# Patient Record
Sex: Female | Born: 1985
Health system: Southern US, Community
[De-identification: ages and names within clinical notes are randomized; demographics above are authoritative.]

## PROBLEM LIST (undated history)

## (undated) DIAGNOSIS — Z789 Other specified health status: Secondary | ICD-10-CM

## (undated) DIAGNOSIS — N390 Urinary tract infection, site not specified: Secondary | ICD-10-CM

## (undated) DIAGNOSIS — F419 Anxiety disorder, unspecified: Secondary | ICD-10-CM

## (undated) NOTE — H&P (Signed)
 Formatting of this note is different from the original. [facility]    [facility] [phone] [address] [place] [address]  [facility] MODERATE SEDATION H&P  NAME: Jenna Gonzalez MRN: [id number] AGE: 55 year old, DOB: February 14, 1985 Primary care Provider: Mardy Gonzalez Shivers, Gonzalez  Attending Provider:  [place] Jenna DELENA, Gonzalez  Procedure(s): COLONOSCOPY  Diagnosis/ Indications/ Symptoms for procedure:   rectal bleeding  PAST MEDICAL HISTORY: Past Medical History:  Diagnosis Date  ? NO PREVIOUS SIGNIFICANT MEDICAL ILLNESSES.    Past Surgical History:  Procedure Laterality Date  ? KNEE SURGERY Right    CURRENT MEDICATIONS: Current OP Meds    Start     Stop Sig Ordered   08/01/16 0000  adapalene (DIFFERIN) 0.1 % external gel     -- Apply to affected areas once daily. 08/01/16 1332   12/01/15 0000  norgestimate -ethinyl estradiol (TRI-SPRINTEC) 0.18/0.215/0.25 mg-35 mcg oral TABS tablet  daily    Comments:  Hold until patient requests   -- Take 1 tablet by mouth every day. 12/01/15 1630   12/01/15 0000  spironolactone (ALDACTONE) 50 mg oral tablet  daily     11/24 2359 Take 1 tablet (50 mg total) by mouth every day. 12/01/15 1640   12/01/15 0000  venlafaxine , 24-hour, (EFFEXOR  XR) 75 mg oral capsule  daily    Comments:  Hold until patient requests   -- Take 1 capsule (75 mg total) by mouth every day. 12/01/15 1630    ALLERGIES: Amoxicillin  Is this patient taking an anticoagulant or antithrombotic medication?  No.  No results for input(s): PTI, INR in the last 72 hours.  Exam:    Normal    Abnormal (please describe below) Mental Status: [x]                           []   Heart:   [x]     []  Lung:   [x]     []  Abdomen:  [x]     []  HEENT:  []     []  Neck:   []     []  Spine:   []     []  Ext:   []     []   Neuro:   []     []   Abnormal Findings:  none  The ASA Physical Status Classification System:  P1 A normal healthy patient  The benefits of the colonoscopy  reviewed with Jenna Gonzalez are the ability to directly examine, perform tissue sampling and administer treatment.   I have reviewed with her the alternatives for a colonoscopy would be a barium enema, sigmoidoscopy, fecal occult blood testing, or CT colonography.  Risks of colonoscopy were reviewed and the discussion included; minimal risk of bleeding, inflammation, perforation, medication reactions, infection or the need for surgery.  The sedation alternatives, including no sedation have been discussed. The benefits of receiving sedation include temporary decreased level of consciousness; memory loss and increased comfort during the procedure have been reviewed.  I have discussed that there are minimal risks with sedation; they include allergic reactions, respiratory depression, cardiac rhythm changes, and minor drops in oxygen levels and/or blood pressure changes.  Reactions to the medications can be treated to reverse the effects.  All of the above has been discussed with Jenna Gonzalez and her questions have been answered. She understands the risks, benefits and alternatives. She understands that it is possible to have an incomplete examination due to anatomy or effectiveness of the prep.  Jenna Gonzalez wishes to proceed.  Informed consent has been obtained.   Electronically signed by Jenna Gonzalez 11/09/2016 6:15 AM  Electronically signed by Gonzalez Jenna LABOR, Gonzalez at 11/09/2016  6:16 AM CST

---

## 2012-02-16 ENCOUNTER — Ambulatory Visit: Payer: Self-pay

## 2013-06-17 IMAGING — CR DG KNEE COMPLETE 4+V*R*
1 series · 4 of 4 positions shown · non-contrast
Comparison: none

REASON FOR EXAM: pain
COMMENTS:

PROCEDURE:     MDR - MDR KNEE RT COMPLETE W/OBLIQUES  - February 16, 2012  [DATE]
RESULT:     Comparison:  None

[Series 1: ap · 0.17mm/px · 4 of 4 slices shown]
[im 1/4]
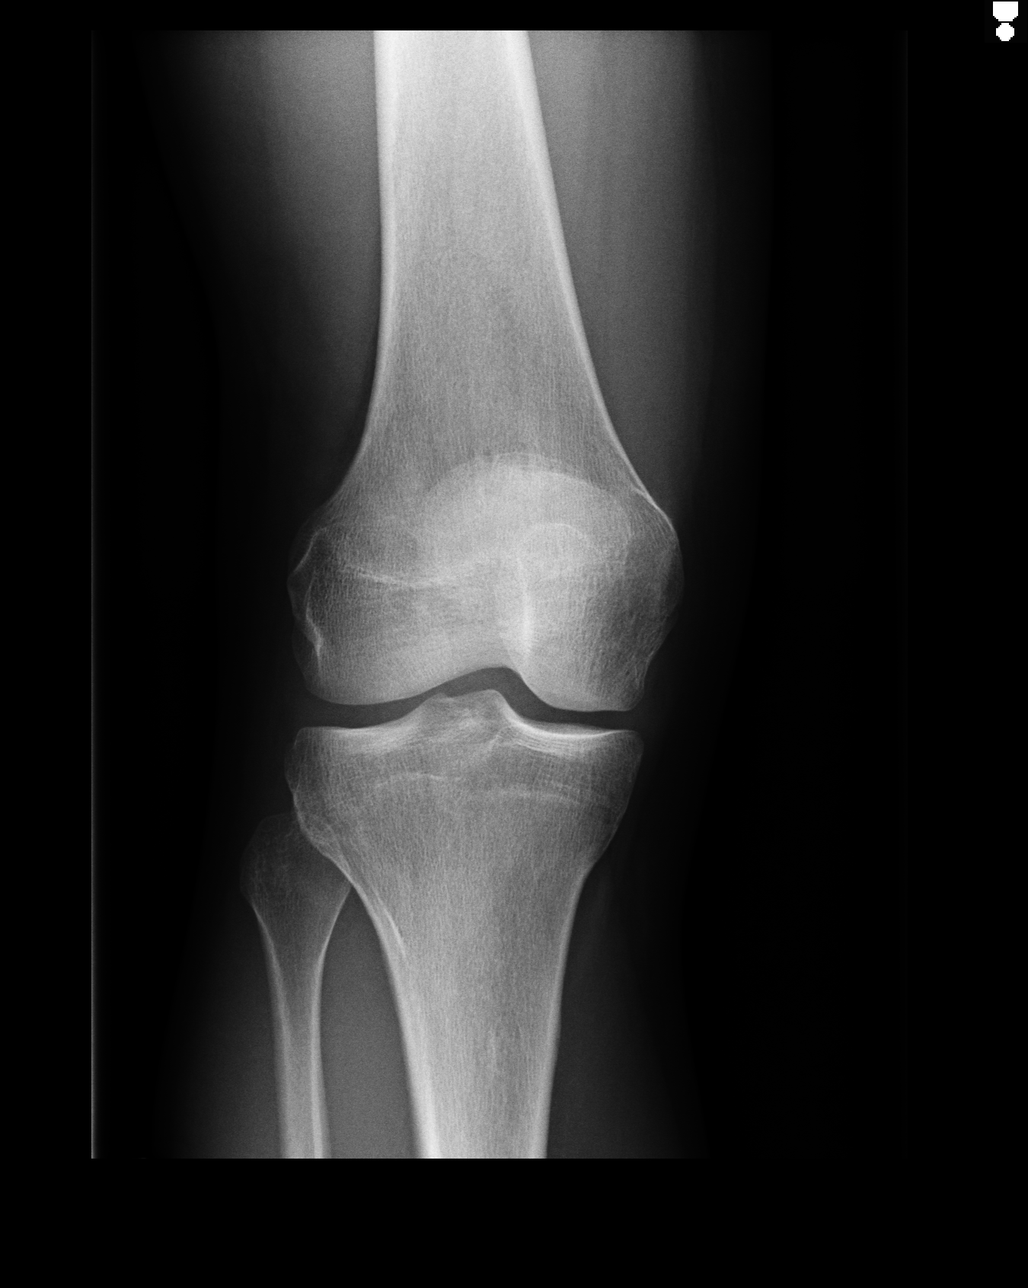
[im 2/4]
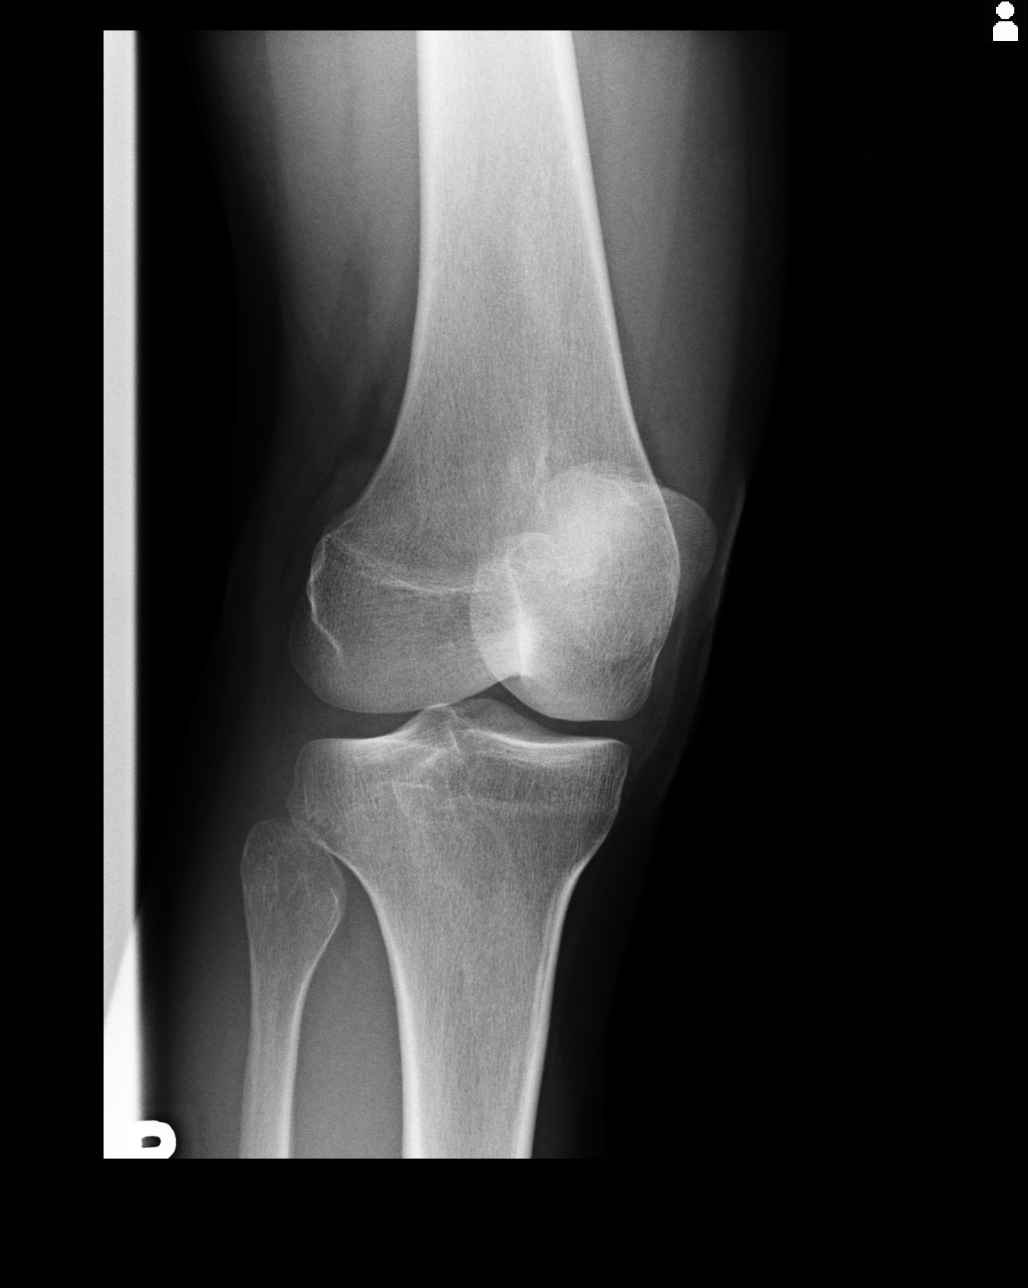
[im 3/4]
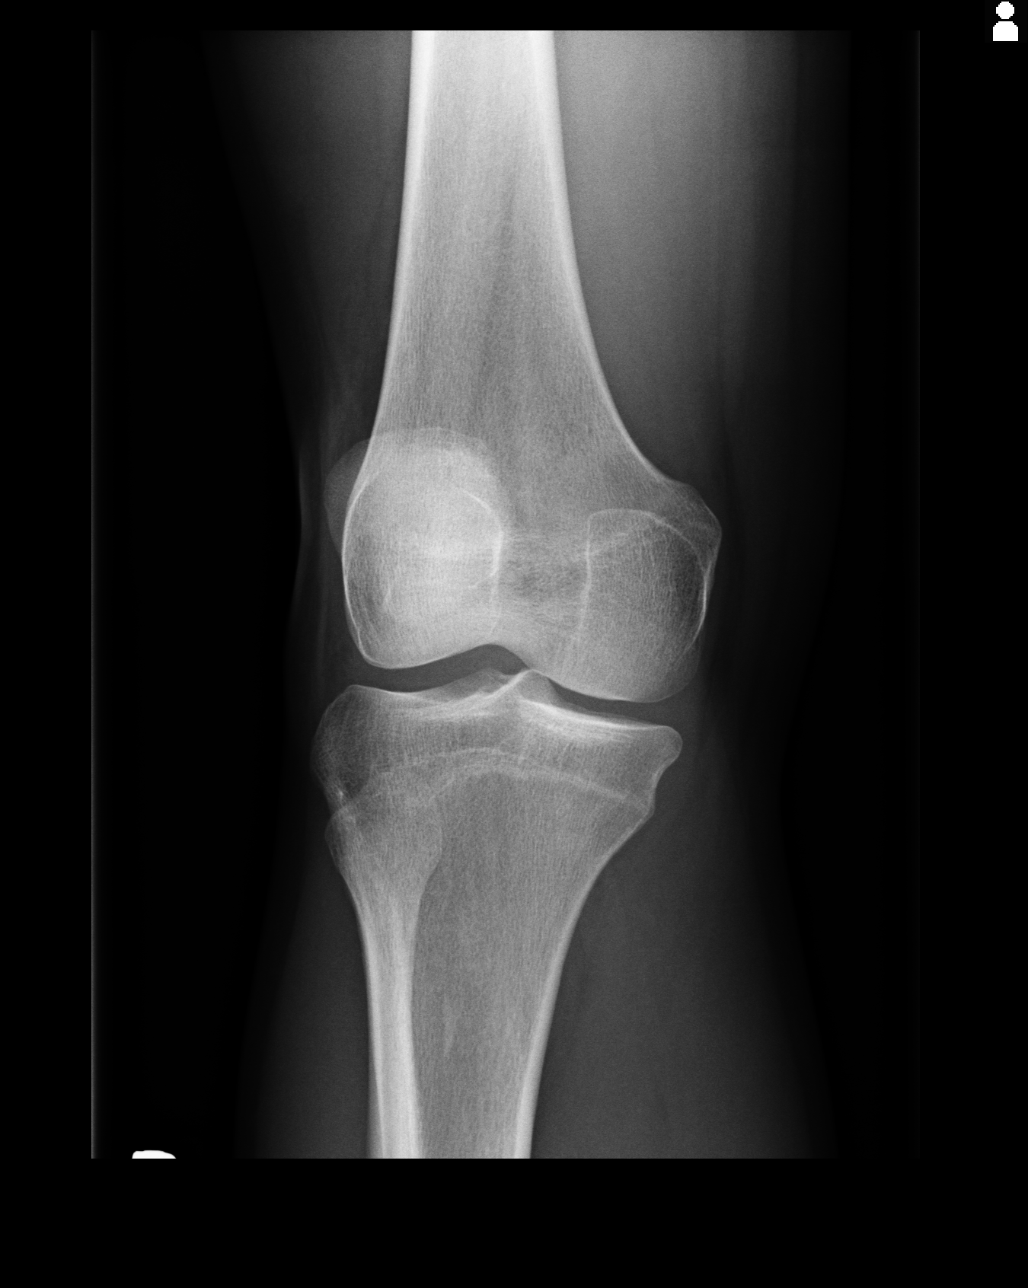
[im 4/4]
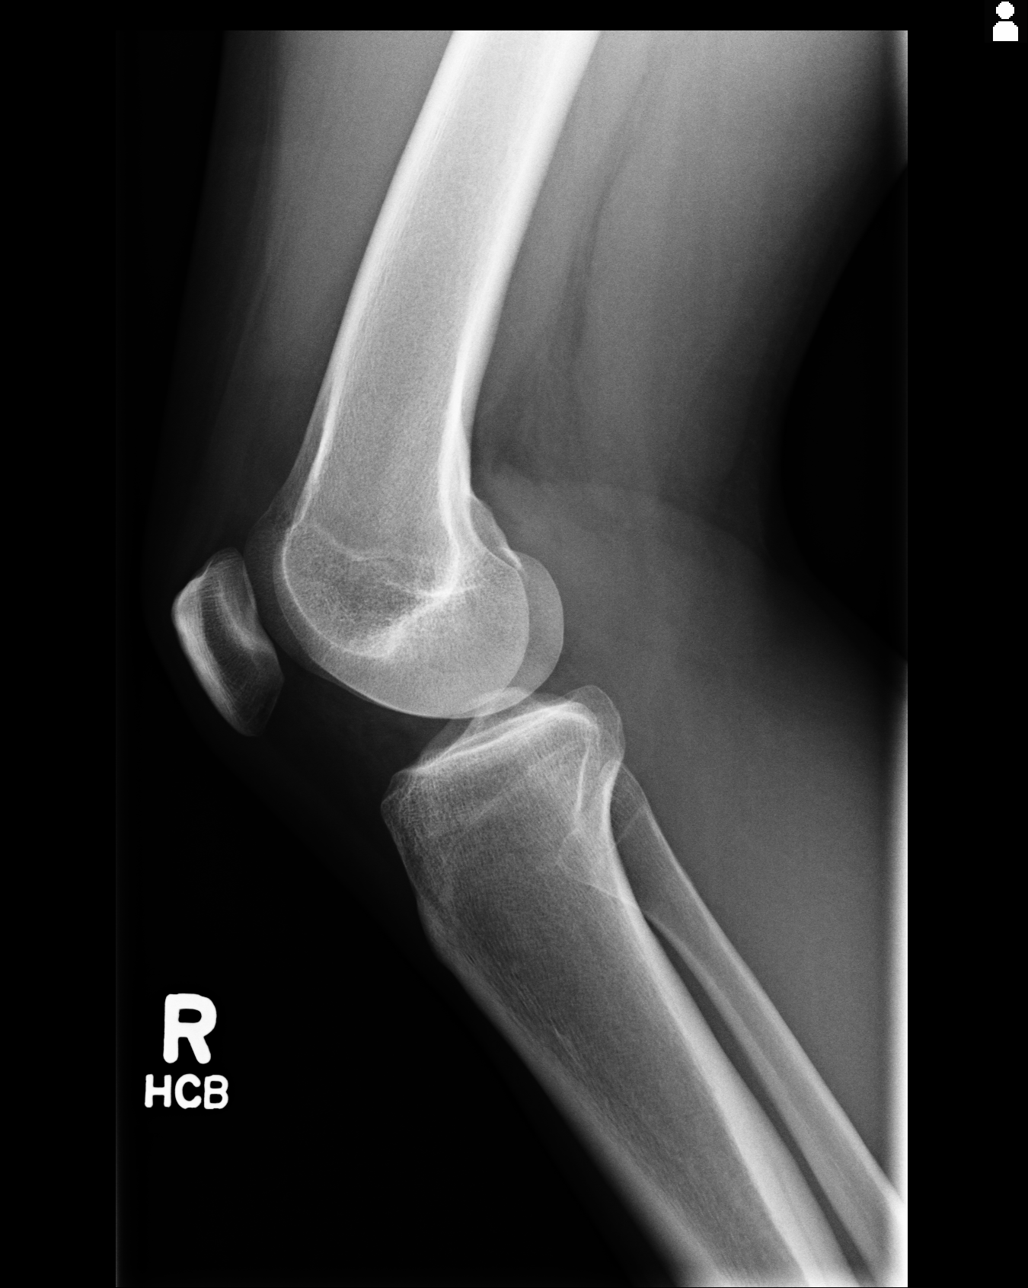

[4 of 4 positions shown; findings below may reference images not displayed]

FINDINGS: 4 views of the right knee demonstrates no acute fracture or dislocation.
There is no significant joint effusion.
IMPRESSION: No acute osseous injury of the right knee.

[REDACTED]

## 2017-01-01 HISTORY — PX: APPENDECTOMY: SHX54

## 2019-06-23 LAB — HM PAP SMEAR: HM Pap smear: NORMAL

## 2020-10-01 ENCOUNTER — Ambulatory Visit: Payer: No Typology Code available for payment source | Admitting: Family

## 2020-10-05 ENCOUNTER — Ambulatory Visit (INDEPENDENT_AMBULATORY_CARE_PROVIDER_SITE_OTHER): Payer: No Typology Code available for payment source | Admitting: Family

## 2020-10-05 ENCOUNTER — Other Ambulatory Visit: Payer: Self-pay | Admitting: Family

## 2020-10-05 ENCOUNTER — Other Ambulatory Visit (HOSPITAL_BASED_OUTPATIENT_CLINIC_OR_DEPARTMENT_OTHER): Payer: Self-pay

## 2020-10-05 ENCOUNTER — Other Ambulatory Visit: Payer: Self-pay

## 2020-10-05 ENCOUNTER — Encounter: Payer: Self-pay | Admitting: Family

## 2020-10-05 VITALS — BP 118/76 | HR 80 | Temp 98.0°F | Ht 65.0 in | Wt 150.4 lb

## 2020-10-05 DIAGNOSIS — Z23 Encounter for immunization: Secondary | ICD-10-CM | POA: Diagnosis not present

## 2020-10-05 DIAGNOSIS — F419 Anxiety disorder, unspecified: Secondary | ICD-10-CM | POA: Diagnosis not present

## 2020-10-05 DIAGNOSIS — R5383 Other fatigue: Secondary | ICD-10-CM | POA: Diagnosis not present

## 2020-10-05 DIAGNOSIS — A630 Anogenital (venereal) warts: Secondary | ICD-10-CM

## 2020-10-05 MED ORDER — VENLAFAXINE HCL ER 37.5 MG PO CP24
37.5000 mg | ORAL_CAPSULE | Freq: Every day | ORAL | 0 refills | Status: DC
  Filled 2020-10-05: qty 90, 90d supply, fill #0

## 2020-10-05 NOTE — Progress Notes (Signed)
  Jenna Gonzalez is a 35 y.o. female with the following history as recorded in EpicCare:  There are no problems to display for this patient.   Current Outpatient Medications  Medication Sig Dispense Refill   venlafaxine XR (EFFEXOR XR) 37.5 MG 24 hr capsule Take 1 capsule (37.5 mg total) by mouth daily with breakfast. 90 capsule 0   No current facility-administered medications for this visit.    Allergies: Amoxicillin  No past medical history on file.  The histories are not reviewed yet. Please review them in the "History" navigator section and refresh this [place].  No family history on file.  Social History   Tobacco Use   Smoking status: Never   Smokeless tobacco: Never  Substance Use Topics   Alcohol use: Not Currently    Subjective:   Presents as a new patient; would like to discuss options for anxiety that are safe for pregnancy and breast-feeding; would like to try to get pregnant mid 2023;  Is currently taking Effexor and has been on for 8- 9 years and done well;    LMP- 2 weeks ago  Objective:  Vitals:   10/05/20 1455  BP: 118/76  Pulse: 80  Temp: 98 F (36.7 C)  TempSrc: Oral  SpO2: 99%  Weight: 150 lb 6.4 oz (68.2 kg)  Height: $Remove'5\' 5"'WlWBVqa$  (1.651 m)    General: Well developed, well nourished, in no acute distress  Skin : Warm and dry.  Head: Normocephalic and atraumatic  Lungs: Respirations unlabored; clear to auscultation bilaterally without wheeze, rales, rhonchi  CVS exam: normal rate and regular rhythm.  Neurologic: Alert and oriented; speech intact; face symmetrical; moves all extremities well; CNII-XII intact without focal deficit   Assessment:  1. Anxiety   2. Other fatigue   3. Need for influenza vaccination     Plan:  Will go ahead and start taper for Effexor due to pre-pregnancy planning; will plan to start Sertraline as alternative;  Check CBC, CMP, TSH today; Flu shot given; Patient also referred to GYN due to history of vaginal wart  requiring treatment;   This visit occurred during the SARS-CoV-2 public health emergency.  Safety protocols were in place, including screening questions prior to the visit, additional usage of staff PPE, and extensive cleaning of exam room while observing appropriate contact time as indicated for disinfecting solutions.    No follow-ups on file.  Orders Placed This Encounter  Procedures   Flu Vaccine QUAD 6+ mos PF IM (Fluarix Quad PF)   CBC with Differential/Platelet   Comp Met (CMET)   TSH   HM PAP SMEAR    This external order was created through the Results Console.     Requested Prescriptions   Signed Prescriptions Disp Refills   venlafaxine XR (EFFEXOR XR) 37.5 MG 24 hr capsule 90 capsule 0    Sig: Take 1 capsule (37.5 mg total) by mouth daily with breakfast.

## 2020-10-05 NOTE — Patient Instructions (Signed)
Taper Effexor XR;  Take 1 tablet daily x 2 weeks of the 37.5 mg dosage; then cut back to every other day x 2 weeks; then cut back to every 3rd day x 1 week; see how you are doing at that point- if feeling okay, go ahead and stop; let us know when how the taper is going and when you are ready to start the Sertraline.

## 2020-10-06 LAB — CBC WITH DIFFERENTIAL/PLATELET
Basophils Absolute: 0.1 10*3/uL (ref 0.0–0.1)
Basophils Relative: 0.9 % (ref 0.0–3.0)
Eosinophils Absolute: 0.1 10*3/uL (ref 0.0–0.7)
Eosinophils Relative: 1.6 % (ref 0.0–5.0)
HCT: 42.2 % (ref 36.0–46.0)
Hemoglobin: 14.2 g/dL (ref 12.0–15.0)
Lymphocytes Relative: 31.7 % (ref 12.0–46.0)
Lymphs Abs: 2.4 10*3/uL (ref 0.7–4.0)
MCHC: 33.6 g/dL (ref 30.0–36.0)
MCV: 89.3 fl (ref 78.0–100.0)
Monocytes Absolute: 0.4 10*3/uL (ref 0.1–1.0)
Monocytes Relative: 4.9 % (ref 3.0–12.0)
Neutro Abs: 4.6 10*3/uL (ref 1.4–7.7)
Neutrophils Relative %: 60.9 % (ref 43.0–77.0)
Platelets: 226 10*3/uL (ref 150.0–400.0)
RBC: 4.72 Mil/uL (ref 3.87–5.11)
RDW: 13.1 % (ref 11.5–15.5)
WBC: 7.6 10*3/uL (ref 4.0–10.5)

## 2020-10-06 LAB — COMPREHENSIVE METABOLIC PANEL
ALT: 12 U/L (ref 0–35)
AST: 18 U/L (ref 0–37)
Albumin: 4.4 g/dL (ref 3.5–5.2)
Alkaline Phosphatase: 64 U/L (ref 39–117)
BUN: 16 mg/dL (ref 6–23)
CO2: 27 mEq/L (ref 19–32)
Calcium: 9.6 mg/dL (ref 8.4–10.5)
Chloride: 101 mEq/L (ref 96–112)
Creatinine, Ser: 0.84 mg/dL (ref 0.40–1.20)
GFR: 91.48 mL/min (ref >60.00)
Glucose, Bld: 87 mg/dL (ref 70–99)
Potassium: 4.1 mEq/L (ref 3.5–5.1)
Sodium: 137 mEq/L (ref 135–145)
Total Bilirubin: 0.3 mg/dL (ref 0.2–1.2)
Total Protein: 6.6 g/dL (ref 6.0–8.3)

## 2020-10-06 LAB — TSH: TSH: 2.36 u[IU]/mL (ref 0.35–5.50)

## 2020-10-13 ENCOUNTER — Telehealth: Payer: Self-pay | Admitting: *Deleted

## 2020-10-13 NOTE — Telephone Encounter (Signed)
Referral was placed for patient to gyn and she has not heard from them.  Referral was placed on 10/05/20.    Do you know if they are behind in referrals at gyn?

## 2020-10-14 NOTE — Telephone Encounter (Signed)
Left message on machine advising she can call.  [facility] given for [facility].

## 2020-12-23 ENCOUNTER — Ambulatory Visit (INDEPENDENT_AMBULATORY_CARE_PROVIDER_SITE_OTHER): Payer: No Typology Code available for payment source | Admitting: Family Medicine

## 2020-12-23 ENCOUNTER — Encounter: Payer: Self-pay | Admitting: Family Medicine

## 2020-12-23 ENCOUNTER — Other Ambulatory Visit (HOSPITAL_COMMUNITY)
Admission: RE | Admit: 2020-12-23 | Discharge: 2020-12-23 | Disposition: A | Discharge status: Home / Self Care | Payer: No Typology Code available for payment source | Source: Ambulatory Visit | Attending: Family Medicine | Admitting: Family Medicine

## 2020-12-23 ENCOUNTER — Other Ambulatory Visit: Payer: Self-pay

## 2020-12-23 VITALS — BP 121/75 | HR 67 | Ht 65.0 in | Wt 150.0 lb

## 2020-12-23 DIAGNOSIS — B079 Viral wart, unspecified: Secondary | ICD-10-CM

## 2020-12-23 DIAGNOSIS — Z01419 Encounter for gynecological examination (general) (routine) without abnormal findings: Secondary | ICD-10-CM | POA: Insufficient documentation

## 2020-12-23 NOTE — Progress Notes (Signed)
GYNECOLOGY ANNUAL PREVENTATIVE CARE ENCOUNTER NOTE  Subjective:   Jenna Gonzalez is a 35 y.o. G0P0000 female here for a routine annual gynecologic exam.  Current complaints: Planing to get pregnant after she completes the half marathon in March.  She is currently taking multivitamin.  She runs several times a week, including long runs of up to an hour and a half.  She still gets up.  That is once a month.  Periods are regular with moderate flow.  Additionally, she does have a small wart that has come and gone over the past several years.  The warts is always frozen off, which stays away for couple years then returns.   Denies abnormal vaginal bleeding, discharge, pelvic pain, problems with intercourse or other gynecologic concerns.    Gynecologic History Patient's last menstrual period was 11/25/2020. Patient is sexually active  Contraception: condoms Last Pap: about 3 years ago. Results were: normal Last mammogram: n/a.   The pregnancy intention screening data noted above was reviewed. Potential methods of contraception were discussed. The patient elected to proceed with No data recorded.   Obstetric History OB History  Gravida Para Term Preterm AB Living  0 0 0 0 0 0  SAB IAB Ectopic Multiple Live Births  0 0 0 0 0    History reviewed. No pertinent past medical history.  History reviewed. No pertinent surgical history.  Current Outpatient Medications on File Prior to Visit  Medication Sig Dispense Refill   venlafaxine XR (EFFEXOR XR) 37.5 MG 24 hr capsule Take 1 capsule (37.5 mg total) by mouth daily with breakfast. 90 capsule 0   No current facility-administered medications on file prior to visit.    Allergies  Allergen Reactions   Amoxicillin Hives and Rash    Social History   Socioeconomic History   Marital status: Married    Spouse name: Not on file   Number of children: Not on file   Years of education: Not on file   Highest education level: Not on file   Occupational History   Not on file  Tobacco Use   Smoking status: Never   Smokeless tobacco: Never  Substance and Sexual Activity   Alcohol use: Not Currently   Drug use: Never   Sexual activity: Yes  Other Topics Concern   Not on file  Social History Narrative   Not on file   Social Determinants of Health   Financial Resource Strain: Not on file  Food Insecurity: Not on file  Transportation Needs: Not on file  Physical Activity: Not on file  Stress: Not on file  Social Connections: Not on file  Intimate Partner Violence: Not on file    Family History  Problem Relation Age of Onset   Hypertension Mother    High Cholesterol Mother     The following portions of the patient's history were reviewed and updated as appropriate: allergies, current medications, past family history, past medical history, past social history, past surgical history and problem list.  Review of Systems Pertinent items are noted in HPI.   Objective:  BP 121/75    Pulse 67    Ht 5\' 5"  (1.651 m)    Wt 150 lb (68 kg)    LMP 11/25/2020    BMI 24.96 kg/m  Wt Readings from Last 3 Encounters:  12/23/20 150 lb (68 kg)  10/05/20 150 lb 6.4 oz (68.2 kg)     Chaperone present during exam  CONSTITUTIONAL: Well-developed, well-nourished female in no  acute distress.  HENT:  Normocephalic, atraumatic, External right and left ear normal. Oropharynx is clear and moist EYES: Conjunctivae and EOM are normal. Pupils are equal, round, and reactive to light. No scleral icterus.  NECK: Normal range of motion, supple, no masses.  Normal thyroid.   CARDIOVASCULAR: Normal heart rate noted, regular rhythm RESPIRATORY: Clear to auscultation bilaterally. Effort and breath sounds normal, no problems with respiration noted. BREASTS: Symmetric in size. No masses, skin changes, nipple drainage, or lymphadenopathy. ABDOMEN: Soft, normal bowel sounds, no distention noted.  No tenderness, rebound or guarding.  PELVIC: small  21mm wart on mons. Normal appearing external genitalia; normal appearing vaginal mucosa and cervix.  No abnormal discharge noted.   MUSCULOSKELETAL: Normal range of motion. No tenderness.  No cyanosis, clubbing, or edema.  2+ distal pulses. SKIN: Skin is warm and dry. No rash noted. Not diaphoretic. No erythema. No pallor. NEUROLOGIC: Alert and oriented to person, place, and time. Normal reflexes, muscle tone coordination. No cranial nerve deficit noted. PSYCHIATRIC: Normal mood and affect. Normal behavior. Normal judgment and thought content.  Assessment:  Annual gynecologic examination with pap smear   Plan:  1. Well Woman Exam Will follow up results of pap smear and manage accordingly. STD testing discussed. Patient requested testing - Cytology - PAP( [place])  2. Loura Back Will schedule patient for cryotherapy  Routine preventative health maintenance measures emphasized. Please refer to After Visit Summary for other counseling recommendations.    Candelaria Celeste, DO [facility]

## 2021-01-04 ENCOUNTER — Other Ambulatory Visit (HOSPITAL_BASED_OUTPATIENT_CLINIC_OR_DEPARTMENT_OTHER): Payer: Self-pay

## 2021-01-04 ENCOUNTER — Other Ambulatory Visit (HOSPITAL_COMMUNITY): Payer: Self-pay

## 2021-01-04 ENCOUNTER — Other Ambulatory Visit: Payer: Self-pay | Admitting: Family

## 2021-01-04 ENCOUNTER — Telehealth: Payer: Self-pay | Admitting: Family

## 2021-01-04 LAB — CYTOLOGY - PAP
Adequacy: ABSENT
Chlamydia: NEGATIVE
Comment: NEGATIVE
Comment: NEGATIVE
Comment: NORMAL
Diagnosis: NEGATIVE
High risk HPV: NEGATIVE
Neisseria Gonorrhea: NEGATIVE

## 2021-01-04 MED ORDER — VENLAFAXINE HCL ER 75 MG PO CP24
75.0000 mg | ORAL_CAPSULE | Freq: Every day | ORAL | 3 refills | Status: DC
  Filled 2021-01-04: qty 90, 90d supply, fill #0
  Filled 2021-04-18: qty 90, 90d supply, fill #1
  Filled 2021-07-12: qty 90, 90d supply, fill #2

## 2021-01-04 MED ORDER — VENLAFAXINE HCL ER 75 MG PO CP24
75.0000 mg | ORAL_CAPSULE | Freq: Every day | ORAL | 3 refills | Status: DC
  Filled 2021-01-04: qty 90, 90d supply, fill #0

## 2021-01-04 NOTE — Telephone Encounter (Signed)
Pt has recently saw the OB and is now asking to increase the medication does. Please advise if you are okay with this or does the pt need another appointment?

## 2021-01-04 NOTE — Telephone Encounter (Signed)
Pt called and stated she had spoken with her OBGYN and they stated it was okay for her to keep taking medication below. She wanted to see about getting a refill and getting back on the dosage she was previously on ( 75mg )   venlafaxine XR (EFFEXOR XR) 37.5 MG 24 hr capsule   [facility]  [address], [place]  Phone:  [phone]  Fax:  [phone]  DEA #:  [phone]

## 2021-01-05 NOTE — Telephone Encounter (Signed)
Any word on when we can borrow the liquid nitrogen spray canister from [place]?

## 2021-04-18 ENCOUNTER — Other Ambulatory Visit (HOSPITAL_BASED_OUTPATIENT_CLINIC_OR_DEPARTMENT_OTHER): Payer: Self-pay

## 2021-04-22 ENCOUNTER — Other Ambulatory Visit (HOSPITAL_BASED_OUTPATIENT_CLINIC_OR_DEPARTMENT_OTHER): Payer: Self-pay

## 2021-04-22 ENCOUNTER — Telehealth: Payer: Self-pay | Admitting: Family

## 2021-04-22 ENCOUNTER — Encounter: Payer: Self-pay | Admitting: Family

## 2021-04-22 ENCOUNTER — Ambulatory Visit (INDEPENDENT_AMBULATORY_CARE_PROVIDER_SITE_OTHER): Payer: No Typology Code available for payment source | Admitting: Family

## 2021-04-22 VITALS — BP 110/60 | HR 73 | Temp 97.9°F | Ht 65.0 in | Wt 145.8 lb

## 2021-04-22 DIAGNOSIS — L709 Acne, unspecified: Secondary | ICD-10-CM | POA: Diagnosis not present

## 2021-04-22 DIAGNOSIS — L309 Dermatitis, unspecified: Secondary | ICD-10-CM | POA: Diagnosis not present

## 2021-04-22 MED ORDER — CLINDAMYCIN PHOSPHATE 1 % EX SOLN
Freq: Two times a day (BID) | CUTANEOUS | 1 refills | Status: DC
  Filled 2021-04-22: qty 60, 30d supply, fill #0

## 2021-04-22 MED ORDER — ALCLOMETASONE DIPROPIONATE 0.05 % EX CREA: TOPICAL_CREAM | Freq: Two times a day (BID) | CUTANEOUS | 0 refills | Status: DC

## 2021-04-22 MED ORDER — ALCLOMETASONE DIPROPIONATE 0.05 % EX CREA
TOPICAL_CREAM | Freq: Two times a day (BID) | CUTANEOUS | 0 refills | Status: DC
  Filled 2021-04-22: qty 60, 30d supply, fill #0

## 2021-04-22 NOTE — Telephone Encounter (Signed)
Patient states [facility] wont have the ?alclomethasone (ACLOVATE) 0.05 % cream until Monday so she would like the prescription sent to [facility] instead. Please advise.  ? ? ?[facility] ?410Phone: [phone], [place], [place] [address] ?

## 2021-04-22 NOTE — Progress Notes (Signed)
?  Jenna Gonzalez is a 36 y.o. female with the following history as recorded in EpicCare:  ?There are no problems to display for this patient. ?  ?Current Outpatient Medications  ?Medication Sig Dispense Refill  ? alclomethasone (ACLOVATE) 0.05 % cream Apply on to the skin 2 (two) times daily. 60 g 0  ? clindamycin (CLEOCIN-T) 1 % external solution Apply on to the skin 2 (two) times daily. 30 mL 1  ? venlafaxine XR (EFFEXOR XR) 75 MG 24 hr capsule Take 1 capsule (75 mg total) by mouth daily with breakfast. 90 capsule 3  ? ?No current facility-administered medications for this visit.  ?  ?Allergies: Amoxicillin  ?No past medical history on file.  ?No past surgical history on file.  ?Family History  ?Problem Relation Age of Onset  ? Hypertension Mother   ? High Cholesterol Mother   ?  ?Social History  ? ?Tobacco Use  ? Smoking status: Never  ? Smokeless tobacco: Never  ?Substance Use Topics  ? Alcohol use: Not Currently  ?  ?Subjective:  ?Patient has known history of acne; has been on Spironolactone in the past to treat acne but has been off for 6 more six months as part of pregnancy planning;  ?Has been having to use increased amounts of Differin; admits has been using in larger amounts "than probably should." Was running this past weekend and had red rash/ burning sensation on face after using Differin; ? ?LMP 04/04/2021- actively trying to get pregnant ? ? ? ?Objective:  ?Vitals:  ? 04/22/21 0952  ?BP: 110/60  ?Pulse: 73  ?Temp: 97.9 ?F (36.6 ?C)  ?TempSrc: Oral  ?SpO2: 98%  ?Weight: 145 lb 12.8 oz (66.1 kg)  ?Height: 5\' 5"  (1.651 m)  ?  ?General: Well developed, well nourished, in no acute distress  ?Skin : Warm and dry. Acne noted on bilateral cheeks; erythema noted around upper lips/ skin peeling; ?Head: Normocephalic and atraumatic  ?Lungs: Respirations unlabored;  ?Neurologic: Alert and oriented; speech intact; face symmetrical; moves all extremities well; CNII-XII intact without focal deficit  ? ?Assessment:   ?1. Acne, unspecified acne type   ?2. Dermatitis   ?  ?Plan:  ?Suspect sun reaction due to increased use of Differin; agree that patient will hold this medication; short term use of Alclometasone bid to calm allergic reaction;  ?Will then switch to topical Clindamycin to treat acne- reviewed that this is considered safe for pregnancy;  ?Follow up worse, no better- to consider dermatology follow up;  ? ?This visit occurred during the SARS-CoV-2 public health emergency.  Safety protocols were in place, including screening questions prior to the visit, additional usage of staff PPE, and extensive cleaning of exam room while observing appropriate contact time as indicated for disinfecting solutions.  ? ? ?No follow-ups on file.  ?No orders of the defined types were placed in this encounter. ?  ?Requested Prescriptions  ? ?Signed Prescriptions Disp Refills  ? alclomethasone (ACLOVATE) 0.05 % cream 60 g 0  ?  Sig: Apply on to the skin 2 (two) times daily.  ? clindamycin (CLEOCIN-T) 1 % external solution 30 mL 1  ?  Sig: Apply on to the skin 2 (two) times daily.  ?  ? ?

## 2021-04-22 NOTE — Telephone Encounter (Signed)
Rx sent and pt notified.

## 2021-04-25 ENCOUNTER — Other Ambulatory Visit (HOSPITAL_BASED_OUTPATIENT_CLINIC_OR_DEPARTMENT_OTHER): Payer: Self-pay

## 2021-06-24 ENCOUNTER — Other Ambulatory Visit (HOSPITAL_BASED_OUTPATIENT_CLINIC_OR_DEPARTMENT_OTHER): Payer: Self-pay

## 2021-06-24 ENCOUNTER — Ambulatory Visit (INDEPENDENT_AMBULATORY_CARE_PROVIDER_SITE_OTHER): Payer: No Typology Code available for payment source | Admitting: Family Medicine

## 2021-06-24 ENCOUNTER — Encounter: Payer: Self-pay | Admitting: Family Medicine

## 2021-06-24 VITALS — BP 118/68 | HR 71 | Wt 146.0 lb

## 2021-06-24 DIAGNOSIS — B079 Viral wart, unspecified: Secondary | ICD-10-CM

## 2021-06-24 MED ORDER — DOXYLAMINE-PYRIDOXINE 10-10 MG PO TBEC
2.0000 | DELAYED_RELEASE_TABLET | Freq: Every day | ORAL | 5 refills | Status: DC
Start: 2021-06-24 — End: 2021-07-14
  Filled 2021-06-24: qty 100, 25d supply, fill #0

## 2021-06-24 NOTE — Progress Notes (Signed)
Cryotherapy done for 8mm wart on left mons per patient request. Liquid nitrogen applied, encompassing the wart and 2mm around it.  Discussed after care and that may need to repeat.

## 2021-06-30 ENCOUNTER — Encounter: Payer: Self-pay | Admitting: General Practice

## 2021-07-12 ENCOUNTER — Other Ambulatory Visit: Payer: Self-pay | Admitting: Family

## 2021-07-13 ENCOUNTER — Other Ambulatory Visit (HOSPITAL_BASED_OUTPATIENT_CLINIC_OR_DEPARTMENT_OTHER): Payer: Self-pay

## 2021-07-13 MED ORDER — CLINDAMYCIN PHOSPHATE 1 % EX SOLN
Freq: Two times a day (BID) | CUTANEOUS | 0 refills | Status: DC
  Filled 2021-07-13: qty 60, 30d supply, fill #0

## 2021-07-14 ENCOUNTER — Ambulatory Visit (INDEPENDENT_AMBULATORY_CARE_PROVIDER_SITE_OTHER): Payer: No Typology Code available for payment source | Admitting: Family Medicine

## 2021-07-14 ENCOUNTER — Other Ambulatory Visit (HOSPITAL_COMMUNITY)
Admission: RE | Admit: 2021-07-14 | Discharge: 2021-07-14 | Disposition: A | Discharge status: Home / Self Care | Payer: No Typology Code available for payment source | Source: Ambulatory Visit | Attending: Obstetrics & Gynecology | Admitting: Obstetrics & Gynecology

## 2021-07-14 ENCOUNTER — Encounter: Payer: Self-pay | Admitting: Family Medicine

## 2021-07-14 VITALS — BP 127/68 | HR 73 | Wt 148.0 lb

## 2021-07-14 DIAGNOSIS — Z3401 Encounter for supervision of normal first pregnancy, first trimester: Secondary | ICD-10-CM | POA: Diagnosis not present

## 2021-07-14 DIAGNOSIS — Z3A10 10 weeks gestation of pregnancy: Secondary | ICD-10-CM | POA: Diagnosis not present

## 2021-07-14 DIAGNOSIS — Z3A34 34 weeks gestation of pregnancy: Secondary | ICD-10-CM

## 2021-07-14 DIAGNOSIS — Z3483 Encounter for supervision of other normal pregnancy, third trimester: Secondary | ICD-10-CM

## 2021-07-14 DIAGNOSIS — Z3480 Encounter for supervision of other normal pregnancy, unspecified trimester: Secondary | ICD-10-CM | POA: Insufficient documentation

## 2021-07-14 DIAGNOSIS — F419 Anxiety disorder, unspecified: Secondary | ICD-10-CM

## 2021-07-14 DIAGNOSIS — Z363 Encounter for antenatal screening for malformations: Secondary | ICD-10-CM | POA: Diagnosis not present

## 2021-07-14 DIAGNOSIS — Z3481 Encounter for supervision of other normal pregnancy, first trimester: Secondary | ICD-10-CM | POA: Diagnosis not present

## 2021-07-14 NOTE — Progress Notes (Signed)
  Subjective:  Jenna Gonzalez is a G1P0000 [redacted]w[redacted]d being seen today for her first obstetrical visit.  Her obstetrical history is significant for  first pregnancy . She is a runner and has continued to run. This is a planned pregnancy. FOB is patient's husband. Patient does intend to breast feed. Pregnancy history fully reviewed.  Patient reports nausea.  BP 127/68   Pulse 73   Wt 148 lb (67.1 kg)   LMP 05/02/2021   BMI 24.63 kg/m   HISTORY: OB History  Gravida Para Term Preterm AB Living  1 0 0 0 0 0  SAB IAB Ectopic Multiple Live Births  0 0 0 0 0    # Outcome Date GA Lbr Len/2nd Weight Sex Delivery Anes PTL Lv  1 Current             History reviewed. No pertinent past medical history.  Past Surgical History:  Procedure Laterality Date   APPENDECTOMY  01/01/2017    Family History  Problem Relation Age of Onset   Hypertension Mother    High Cholesterol Mother    Alcohol abuse Father      Exam  BP 127/68   Pulse 73   Wt 148 lb (67.1 kg)   LMP 05/02/2021   BMI 24.63 kg/m   Chaperone present during exam  CONSTITUTIONAL: Well-developed, well-nourished female in no acute distress.  HENT:  Normocephalic, atraumatic, External right and left ear normal. Oropharynx is clear and moist EYES: Conjunctivae and EOM are normal. Pupils are equal, round, and reactive to light. No scleral icterus.  NECK: Normal range of motion, supple, no masses.  Normal thyroid.  CARDIOVASCULAR: Normal heart rate noted, regular rhythm RESPIRATORY: Clear to auscultation bilaterally. Effort and breath sounds normal, no problems with respiration noted. BREASTS: has recent annual exam ABDOMEN: Soft, normal bowel sounds, no distention noted.  No tenderness, rebound or guarding.  PELVIC: has recent annual exam MUSCULOSKELETAL: Normal range of motion. No tenderness.  No cyanosis, clubbing, or edema.  2+ distal pulses. SKIN: Skin is warm and dry. No rash noted. Not diaphoretic. No erythema. No  pallor. NEUROLOGIC: Alert and oriented to person, place, and time. Normal reflexes, muscle tone coordination. No cranial nerve deficit noted. PSYCHIATRIC: Normal mood and affect. Normal behavior. Normal judgment and thought content.    Assessment:    Pregnancy: G1P0000 Patient Active Problem List   Diagnosis Date Noted   Supervision of other normal pregnancy, antepartum 07/14/2021      Plan:   1. Supervision of other normal pregnancy, antepartum FHT and FH normal LMP c/w [place] today. Desires panorama - will get prenatal labs next week with Panorama Low risk pregnancy - ASA 81mg  not indicated. - GC/Chlamydia probe amp ([place])not at [facility] - Urine Culture - Enroll Patient in PreNatal Babyscripts - Babyscripts Schedule Optimization - [facility] MFM OB COMP + 14 WK; Future  2. [redacted] weeks gestation of pregnancy - GC/Chlamydia probe amp ([place])not at [facility] - Urine Culture - Enroll Patient in PreNatal Babyscripts - Babyscripts Schedule Optimization - [facility] MFM OB COMP + 14 WK; Future    Initial labs obtained Continue prenatal vitamins Reviewed n/v relief measures and warning s/s to report Reviewed recommended weight gain based on pre-gravid BMI Encouraged well-balanced diet  Problem list reviewed and updated. 75% of 30 min visit spent on counseling and coordination of care.     [place] 07/14/2021

## 2021-07-14 NOTE — Progress Notes (Signed)
Last Pap 12/23/20- WNL. Armandina Stammer RN DATING AND VIABILITY SONOGRAM   Jenna Gonzalez is a 36 y.o. year old G1P0000 with LMP Patient's last menstrual period was 05/02/2021. which would correlate to  [redacted]w[redacted]d weeks gestation.  She has regular menstrual cycles.   She is here today for a confirmatory initial sonogram.    GESTATION: SINGLETON     FETAL ACTIVITY:          Heart rate         151 bpm          The fetus is active.    GESTATIONAL AGE AND  BIOMETRICS:  Gestational criteria: Estimated Date of Delivery: 02/06/22 by LMP now at [redacted]w[redacted]d  Previous Scans:0      CROWN RUMP LENGTH          4.39 cm         11.1 weeks           4.05 cm          11.0  weeks                                                                           AVERAGE EGA(BY THIS SCAN):  11-0 weeks  WORKING EDD( LMP ):  02-06-2022     TECHNICIAN COMMENTS: Patient informed that the ultrasound is considered a limited obstetric ultrasound and is not intended to be a complete ultrasound exam.  Patient also informed that the ultrasound is not being completed with the intent of assessing for fetal or placental anomalies or any pelvic abnormalities. Explained that the purpose of today's ultrasound is to assess for fetal heart rate.  Patient acknowledges the purpose of the exam and the limitations of the study.     Armandina Stammer 07/14/2021 9:30 AM

## 2021-07-15 LAB — GC/CHLAMYDIA PROBE AMP (~~LOC~~) NOT AT ARMC
Chlamydia: NEGATIVE
Comment: NEGATIVE
Comment: NORMAL
Neisseria Gonorrhea: NEGATIVE

## 2021-07-16 LAB — URINE CULTURE: Organism ID, Bacteria: NO GROWTH

## 2021-07-20 ENCOUNTER — Ambulatory Visit: Payer: No Typology Code available for payment source

## 2021-07-20 DIAGNOSIS — Z3480 Encounter for supervision of other normal pregnancy, unspecified trimester: Secondary | ICD-10-CM

## 2021-07-20 NOTE — Progress Notes (Signed)
Patient presents for lab draw.

## 2021-07-21 ENCOUNTER — Ambulatory Visit: Payer: No Typology Code available for payment source

## 2021-07-21 LAB — CBC/D/PLT+RPR+RH+ABO+RUBIGG...
Antibody Screen: NEGATIVE
Basophils Absolute: 0.1 10*3/uL (ref 0.0–0.2)
Basos: 1 %
EOS (ABSOLUTE): 0.1 10*3/uL (ref 0.0–0.4)
Eos: 1 %
HCV Ab: NONREACTIVE
HIV Screen 4th Generation wRfx: NONREACTIVE
Hematocrit: 39.1 % (ref 34.0–46.6)
Hemoglobin: 13.4 g/dL (ref 11.1–15.9)
Hepatitis B Surface Ag: NEGATIVE
Immature Grans (Abs): 0 10*3/uL (ref 0.0–0.1)
Immature Granulocytes: 0 %
Lymphocytes Absolute: 1.5 10*3/uL (ref 0.7–3.1)
Lymphs: 16 %
MCH: 30.5 pg (ref 26.6–33.0)
MCHC: 34.3 g/dL (ref 31.5–35.7)
MCV: 89 fL (ref 79–97)
Monocytes Absolute: 0.4 10*3/uL (ref 0.1–0.9)
Monocytes: 4 %
Neutrophils Absolute: 7.3 10*3/uL — ABNORMAL HIGH (ref 1.4–7.0)
Neutrophils: 78 %
Platelets: 222 10*3/uL (ref 150–450)
RBC: 4.39 x10E6/uL (ref 3.77–5.28)
RDW: 12.5 % (ref 11.7–15.4)
RPR Ser Ql: NONREACTIVE
Rh Factor: POSITIVE
Rubella Antibodies, IGG: 1.14 index (ref >0.99)
WBC: 9.3 10*3/uL (ref 3.4–10.8)

## 2021-07-21 LAB — HCV INTERPRETATION

## 2021-07-21 LAB — HEPATITIS C ANTIBODY: Hep C Virus Ab: NONREACTIVE

## 2021-07-25 LAB — PANORAMA PRENATAL TEST FULL PANEL:PANORAMA TEST PLUS 5 ADDITIONAL MICRODELETIONS: FETAL FRACTION: 10.7

## 2021-07-28 LAB — HORIZON 4 (SMA, CF, FRAGILE X, DMD)
CYSTIC FIBROSIS: NEGATIVE
DUCHENNE/BECKER MUSCULAR DYSTROPHY: NEGATIVE
FRAGILE X SYNDROME: NEGATIVE
REPORT SUMMARY: NEGATIVE
SPINAL MUSCULAR ATROPHY: NEGATIVE

## 2021-08-02 ENCOUNTER — Encounter: Payer: Self-pay | Admitting: General Practice

## 2021-08-11 ENCOUNTER — Ambulatory Visit (INDEPENDENT_AMBULATORY_CARE_PROVIDER_SITE_OTHER): Payer: No Typology Code available for payment source | Admitting: Family Medicine

## 2021-08-11 ENCOUNTER — Encounter: Payer: Self-pay | Admitting: Family Medicine

## 2021-08-11 VITALS — BP 115/71 | HR 70 | Wt 149.1 lb

## 2021-08-11 DIAGNOSIS — Z3A14 14 weeks gestation of pregnancy: Secondary | ICD-10-CM

## 2021-08-11 DIAGNOSIS — Z3480 Encounter for supervision of other normal pregnancy, unspecified trimester: Secondary | ICD-10-CM

## 2021-08-11 DIAGNOSIS — Z3482 Encounter for supervision of other normal pregnancy, second trimester: Secondary | ICD-10-CM

## 2021-08-11 NOTE — Progress Notes (Signed)
   PRENATAL VISIT NOTE  Subjective:  Jenna Gonzalez is a 36 y.o. G1P0000 at [redacted]w[redacted]d being seen today for ongoing prenatal care.  She is currently monitored for the following issues for this low-risk pregnancy and has Supervision of other normal pregnancy, antepartum on their problem list.  Patient reports no complaints.  Contractions: Not present. Vag. Bleeding: None.  Movement: Absent. Denies leaking of fluid.   The following portions of the patient's history were reviewed and updated as appropriate: allergies, current medications, past family history, past medical history, past social history, past surgical history and problem list.   Objective:   Vitals:   08/11/21 1605  BP: 115/71  Pulse: 70  Weight: 149 lb 1.9 oz (67.6 kg)    Fetal Status: Fetal Heart Rate (bpm): 146 Fundal Height: 14 cm Movement: Absent     General:  Alert, oriented and cooperative. Patient is in no acute distress.  Skin: Skin is warm and dry. No rash noted.   Cardiovascular: Normal heart rate noted  Respiratory: Normal respiratory effort, no problems with respiration noted  Abdomen: Soft, gravid, appropriate for gestational age.  Pain/Pressure: Present     Pelvic: Cervical exam deferred        Extremities: Normal range of motion.  Edema: None  Mental Status: Normal mood and affect. Normal behavior. Normal judgment and thought content.   Assessment and Plan:  Pregnancy: G1P0000 at [redacted]w[redacted]d 1. [redacted] weeks gestation of pregnancy  2. Supervision of other normal pregnancy, antepartum FHT and FH normal LR [employer] is back - eating well. Running about 5 miles several times a week.  Preterm labor symptoms and general obstetric precautions including but not limited to vaginal bleeding, contractions, leaking of fluid and fetal movement were reviewed in detail with the patient. Please refer to After Visit Summary for other counseling recommendations.   No follow-ups on file.  Future Appointments  Date Time  Provider Department Center  09/12/2021  9:45 AM [facility] US5 [facility]  09/16/2021  9:15 AM Levie Heritage, DO [facility] None  10/07/2021  1:00 PM Olive Bass, FNP [facility]  10/12/2021  9:35 AM Levie Heritage, DO [facility] None    Levie Heritage, DO

## 2021-09-12 ENCOUNTER — Ambulatory Visit: Payer: No Typology Code available for payment source | Attending: Family Medicine

## 2021-09-12 ENCOUNTER — Other Ambulatory Visit: Payer: Self-pay | Admitting: *Deleted

## 2021-09-12 DIAGNOSIS — Z363 Encounter for antenatal screening for malformations: Secondary | ICD-10-CM | POA: Insufficient documentation

## 2021-09-12 DIAGNOSIS — Z3A10 10 weeks gestation of pregnancy: Secondary | ICD-10-CM | POA: Diagnosis not present

## 2021-09-12 DIAGNOSIS — Z3480 Encounter for supervision of other normal pregnancy, unspecified trimester: Secondary | ICD-10-CM | POA: Diagnosis not present

## 2021-09-12 DIAGNOSIS — Z3A19 19 weeks gestation of pregnancy: Secondary | ICD-10-CM | POA: Diagnosis not present

## 2021-09-12 DIAGNOSIS — Z362 Encounter for other antenatal screening follow-up: Secondary | ICD-10-CM

## 2021-09-16 ENCOUNTER — Ambulatory Visit (INDEPENDENT_AMBULATORY_CARE_PROVIDER_SITE_OTHER): Payer: No Typology Code available for payment source | Admitting: Family Medicine

## 2021-09-16 VITALS — BP 128/67 | HR 68 | Wt 153.0 lb

## 2021-09-16 DIAGNOSIS — Z3A19 19 weeks gestation of pregnancy: Secondary | ICD-10-CM

## 2021-09-16 DIAGNOSIS — Z3480 Encounter for supervision of other normal pregnancy, unspecified trimester: Secondary | ICD-10-CM

## 2021-09-16 NOTE — Progress Notes (Signed)
   PRENATAL VISIT NOTE  Subjective:  Jenna Gonzalez is a 36 y.o. G1P0000 at [redacted]w[redacted]d being seen today for ongoing prenatal care.  She is currently monitored for the following issues for this low-risk pregnancy and has Supervision of other normal pregnancy, antepartum on their problem list.  Patient reports no complaints.  Contractions: Not present. Vag. Bleeding: Scant.  Movement: Present. Denies leaking of fluid.   The following portions of the patient's history were reviewed and updated as appropriate: allergies, current medications, past family history, past medical history, past social history, past surgical history and problem list.   Objective:   Vitals:   09/16/21 0914  BP: 128/67  Pulse: 68  Weight: 153 lb (69.4 kg)    Fetal Status: Fetal Heart Rate (bpm): 145   Movement: Present     General:  Alert, oriented and cooperative. Patient is in no acute distress.  Skin: Skin is warm and dry. No rash noted.   Cardiovascular: Normal heart rate noted  Respiratory: Normal respiratory effort, no problems with respiration noted  Abdomen: Soft, gravid, appropriate for gestational age.  Pain/Pressure: Present     Pelvic: Cervical exam deferred        Extremities: Normal range of motion.  Edema: None  Mental Status: Normal mood and affect. Normal behavior. Normal judgment and thought content.   Assessment and Plan:  Pregnancy: G1P0000 at [redacted]w[redacted]d 1. Supervision of other normal pregnancy, antepartum FHT and FH normal Incomplete [place], but all normal. Repeat US in 4 weeks  2. [redacted] weeks gestation of pregnancy   Preterm labor symptoms and general obstetric precautions including but not limited to vaginal bleeding, contractions, leaking of fluid and fetal movement were reviewed in detail with the patient. Please refer to After Visit Summary for other counseling recommendations.   No follow-ups on file.  Future Appointments  Date Time Provider Department Center  10/07/2021  1:00 PM Olive Bass, FNP [facility]  10/12/2021  9:35 AM Levie Heritage, DO [facility] None  10/12/2021  1:15 PM [facility] NURSE [facility]  10/12/2021  1:30 PM [facility] US2 [facility]  11/16/2021  4:10 PM Willodean Rosenthal, MD [facility] None    Levie Heritage, DO

## 2021-09-28 ENCOUNTER — Other Ambulatory Visit (HOSPITAL_BASED_OUTPATIENT_CLINIC_OR_DEPARTMENT_OTHER): Payer: Self-pay

## 2021-09-28 ENCOUNTER — Telehealth: Payer: Self-pay

## 2021-09-28 MED ORDER — NIRMATRELVIR/RITONAVIR (PAXLOVID)TABLET
3.0000 | ORAL_TABLET | Freq: Two times a day (BID) | ORAL | 0 refills | Status: AC
  Filled 2021-09-28: qty 30, 5d supply, fill #0

## 2021-09-28 NOTE — Telephone Encounter (Signed)
Patient tested positive for Covid on 09-28-21 Patient states she started not feeling  well on 0-23-23.  Reviewed over the counter meds that are safe in pregnancy and encouraged hydration.  Will route to provider and them know in case he would like to add any other remedies. [facility]

## 2021-09-28 NOTE — Telephone Encounter (Signed)
Called patient. Main complaint is sore throat, hoarseness, fever. Took several tests for the past few days, finally tested positive today. Some SOB with climbing stairs. Paxlovid prescribed. Patient to call if having chest tightness or SOB prior to weekend - would send in albuterol HFA if becoming more symptomatic.

## 2021-09-28 NOTE — Addendum Note (Signed)
Addended by: Truett Mainland on: 09/28/2021 03:31 PM   Modules accepted: Orders

## 2021-10-07 ENCOUNTER — Encounter: Payer: Self-pay | Admitting: Family

## 2021-10-07 ENCOUNTER — Other Ambulatory Visit (HOSPITAL_BASED_OUTPATIENT_CLINIC_OR_DEPARTMENT_OTHER): Payer: Self-pay

## 2021-10-07 ENCOUNTER — Ambulatory Visit (INDEPENDENT_AMBULATORY_CARE_PROVIDER_SITE_OTHER): Payer: No Typology Code available for payment source | Admitting: Family

## 2021-10-07 VITALS — BP 102/56 | HR 87 | Temp 98.1°F | Ht 65.0 in | Wt 157.0 lb

## 2021-10-07 DIAGNOSIS — Z0000 Encounter for general adult medical examination without abnormal findings: Secondary | ICD-10-CM | POA: Diagnosis not present

## 2021-10-07 MED ORDER — VENLAFAXINE HCL ER 75 MG PO CP24
75.0000 mg | ORAL_CAPSULE | Freq: Every day | ORAL | 3 refills | Status: DC
  Filled 2021-10-07: qty 90, 90d supply, fill #0
  Filled 2022-01-08: qty 90, 90d supply, fill #1
  Filled 2022-04-12: qty 90, 90d supply, fill #2
  Filled 2022-07-09: qty 90, 90d supply, fill #3

## 2021-10-07 NOTE — Progress Notes (Signed)
  Jenna Gonzalez is a 36 y.o. female with the following history as recorded in EpicCare:  Patient Active Problem List   Diagnosis Date Noted   Supervision of other normal pregnancy, antepartum 07/14/2021    Current Outpatient Medications  Medication Sig Dispense Refill   Prenatal Vit-Fe Fumarate-FA (PRENATAL VITAMINS PO) Take by mouth.     venlafaxine XR (EFFEXOR XR) 75 MG 24 hr capsule Take 1 capsule (75 mg total) by mouth daily with breakfast. 90 capsule 3   No current facility-administered medications for this visit.    Allergies: Amoxicillin  No past medical history on file.  Past Surgical History:  Procedure Laterality Date   APPENDECTOMY  01/01/2017    Family History  Problem Relation Age of Onset   Hypertension Mother    High Cholesterol Mother    Alcohol abuse Father     Social History   Tobacco Use   Smoking status: Never   Smokeless tobacco: Never  Substance Use Topics   Alcohol use: Not Currently    Subjective:   Patient presents for yearly CPE; is [redacted] weeks pregnant; doing very well with pregnancy;  Needs CPE for her employer;  Had COVID 2 weeks ago- planning to get flu shot in about 2 weeks;  Will be getting updated Tdap later in pregnancy;   Review of Systems  Constitutional: Negative.   HENT: Negative.    Eyes: Negative.   Respiratory: Negative.    Cardiovascular: Negative.   Gastrointestinal: Negative.   Genitourinary: Negative.   Musculoskeletal: Negative.   Skin: Negative.   Neurological: Negative.   Endo/Heme/Allergies: Negative.   Psychiatric/Behavioral: Negative.        Objective:  Vitals:   10/07/21 1308  BP: (!) 102/56  Pulse: 87  Temp: 98.1 F (36.7 C)  TempSrc: Oral  SpO2: 99%  Weight: 157 lb (71.2 kg)  Height: 5\' 5"  (1.651 m)    General: Well developed, well nourished, in no acute distress  Skin : Warm and dry.  Head: Normocephalic and atraumatic  Eyes: Sclera and conjunctiva clear; pupils round and reactive to light;  extraocular movements intact  Ears: External normal; canals clear; tympanic membranes normal  Oropharynx: Pink, supple. No suspicious lesions  Neck: Supple without thyromegaly, adenopathy  Lungs: Respirations unlabored; clear to auscultation bilaterally without wheeze, rales, rhonchi  CVS exam: normal rate and regular rhythm.  Abdomen: Soft; nontender; nondistended; normoactive bowel sounds; no masses or hepatosplenomegaly  Musculoskeletal: No deformities; no active joint inflammation  Extremities: No edema, cyanosis, clubbing  Vessels: Symmetric bilaterally  Neurologic: Alert and oriented; speech intact; face symmetrical; moves all extremities well; CNII-XII intact without focal deficit  Assessment:  1. PE (physical exam), annual     Plan:  Age appropriate preventive healthcare needs addressed; encouraged regular eye doctor and dental exams; encouraged regular exercise; will update labs and refills as needed today; follow-up to be determined;   No follow-ups on file.  No orders of the defined types were placed in this encounter.   Requested Prescriptions   Signed Prescriptions Disp Refills   venlafaxine XR (EFFEXOR XR) 75 MG 24 hr capsule 90 capsule 3    Sig: Take 1 capsule (75 mg total) by mouth daily with breakfast.

## 2021-10-12 ENCOUNTER — Ambulatory Visit (INDEPENDENT_AMBULATORY_CARE_PROVIDER_SITE_OTHER): Payer: No Typology Code available for payment source | Admitting: Family Medicine

## 2021-10-12 ENCOUNTER — Ambulatory Visit: Payer: No Typology Code available for payment source | Attending: Maternal & Fetal Medicine

## 2021-10-12 ENCOUNTER — Ambulatory Visit: Payer: No Typology Code available for payment source

## 2021-10-12 VITALS — BP 130/72 | HR 72

## 2021-10-12 DIAGNOSIS — Z3A23 23 weeks gestation of pregnancy: Secondary | ICD-10-CM

## 2021-10-12 DIAGNOSIS — O358XX0 Maternal care for other (suspected) fetal abnormality and damage, not applicable or unspecified: Secondary | ICD-10-CM

## 2021-10-12 DIAGNOSIS — Z3480 Encounter for supervision of other normal pregnancy, unspecified trimester: Secondary | ICD-10-CM

## 2021-10-12 DIAGNOSIS — Z3482 Encounter for supervision of other normal pregnancy, second trimester: Secondary | ICD-10-CM

## 2021-10-12 DIAGNOSIS — Z362 Encounter for other antenatal screening follow-up: Secondary | ICD-10-CM | POA: Insufficient documentation

## 2021-10-12 NOTE — Progress Notes (Signed)
   PRENATAL VISIT NOTE  Subjective:  Jenna Gonzalez is a 36 y.o. G1P0000 at [redacted]w[redacted]d being seen today for ongoing prenatal care.  She is currently monitored for the following issues for this low-risk pregnancy and has Supervision of other normal pregnancy, antepartum on their problem list.  Patient reports no complaints.  Contractions: Not present. Vag. Bleeding: None.  Movement: Present. Denies leaking of fluid.   The following portions of the patient's history were reviewed and updated as appropriate: allergies, current medications, past family history, past medical history, past social history, past surgical history and problem list.   Objective:   Vitals:   10/12/21 0934  BP: 130/72  Pulse: 72    Fetal Status: Fetal Heart Rate (bpm): 155 Fundal Height: 24 cm Movement: Present     General:  Alert, oriented and cooperative. Patient is in no acute distress.  Skin: Skin is warm and dry. No rash noted.   Cardiovascular: Normal heart rate noted  Respiratory: Normal respiratory effort, no problems with respiration noted  Abdomen: Soft, gravid, appropriate for gestational age.  Pain/Pressure: Present     Pelvic: Cervical exam deferred        Extremities: Normal range of motion.  Edema: None  Mental Status: Normal mood and affect. Normal behavior. Normal judgment and thought content.   Assessment and Plan:  Pregnancy: G1P0000 at [redacted]w[redacted]d 1. Supervision of other normal pregnancy, antepartum FHT and FH normal Has f/u US today.  Preterm labor symptoms and general obstetric precautions including but not limited to vaginal bleeding, contractions, leaking of fluid and fetal movement were reviewed in detail with the patient. Please refer to After Visit Summary for other counseling recommendations.   No follow-ups on file.  Future Appointments  Date Time Provider [place]  11/08/2021 10:35 AM Seabron Spates, CNM [facility] None  12/13/2021 10:35 AM Gavin Pound, CNM [facility] None   12/29/2021  4:10 PM Truett Mainland, DO [facility] None    Truett Mainland, DO

## 2021-11-08 ENCOUNTER — Ambulatory Visit (INDEPENDENT_AMBULATORY_CARE_PROVIDER_SITE_OTHER): Payer: No Typology Code available for payment source | Admitting: Medical

## 2021-11-08 VITALS — BP 110/68 | HR 74 | Wt 161.0 lb

## 2021-11-08 DIAGNOSIS — Z3482 Encounter for supervision of other normal pregnancy, second trimester: Secondary | ICD-10-CM

## 2021-11-08 DIAGNOSIS — Z23 Encounter for immunization: Secondary | ICD-10-CM | POA: Diagnosis not present

## 2021-11-08 DIAGNOSIS — Z3A27 27 weeks gestation of pregnancy: Secondary | ICD-10-CM

## 2021-11-08 DIAGNOSIS — Z3480 Encounter for supervision of other normal pregnancy, unspecified trimester: Secondary | ICD-10-CM

## 2021-11-08 NOTE — Patient Instructions (Addendum)
Childbirth Education Options: °[facility] Classes:  °Childbirth education classes can help you get ready for a positive parenting experience. You can also meet other expectant parents and get free stuff for your baby. Each class runs for five weeks on the same night and costs $45 for the mother-to-be and her support person. Medicaid covers the cost if you are eligible. Call [phone] to register. °[facility] Childbirth Education: °Classes can vary in availability and schedule is subject to change. For most up-to-date information please visit [email or web address] to review and register.  ° ° ° ° ° °AREA PEDIATRIC/FAMILY PRACTICE PHYSICIANS ° °[place] ([address]) °[facility] °Chambliss, MD; Eniola, MD; Hale, MD; Hensel, MD; McDiarmid, MD; McIntyer, MD; Neal, MD; Walden, MD °[address] [place], [place] [address] °[phone] °Mon-Fri 8:30-12:30, 1:30-5:00 °Providers come to see babies at [facility] °Accepting Medicaid °[facility] °Limited providers who accept newborns: Koirala, MD; Morrow, MD; Wolters, MD °[address], [place], [place] [address] °[phone] °Mon-Fri 8:00-5:30 °Babies seen by providers at [facility] °Does NOT accept Medicaid °Please call early in hospitalization for appointment (limited availability)  °[facility] °Mulberry, MD °[address] [place], [place] [address] °[phone] °Mon, Tue, Thur, Fri 8:30-5:00, Wed 10:00-7:00 (closed 1-2pm) °Babies seen by [facility] providers °Accepting Medicaid °[facility] °Rubin, MD °[address], [place], [place] [address] °[phone] °Mon-Fri 8:30-5:00, Sat 8:30-12:00 °Provider comes to see babies at [facility] °Accepting Medicaid °Must have been referred from current patients or contacted office prior to delivery °[facility] ([facility]) °Brown, MD; Chandler, MD; Ettefagh, MD; Grant, MD; Lester, MD; McCormick, MD; McQueen, MD; Prose, MD; Simha, MD; Stanley, MD; Stryffeler, NP; Tebben, NP °[address], [place], [place] [address] °[phone] °Mon, Tue, Thur, Fri 8:30-5:30, Wed 9:30-5:30, Sat 8:30-12:30 °Babies seen by [facility] providers °Accepting Medicaid °Only accepting infants of first-time parents or siblings of current patients °Hospital discharge coordinator will make follow-up appointment °Jack Amos °[address], [place], [place]  [address] °[phone]   Fax - [phone] °[facility] °[address], [place], [place]  [address] °Phone - [phone]   Fax - [phone] °Shilpa Gosrani °[address], [place], [place]  [address] °[phone] ° °[place] ([address]) °[facility] °Bates, MD; Brassfield, MD; Cooper, Cox, MD; MD; Davis, MD; Dovico, MD; Ettefaugh, MD; Little, MD; Lowe, MD; Keiffer, MD; Melvin, MD; Sumner, MD; Williams, MD °[address], [place], [place] [address] °[phone] °Mon-Fri 8:30-5:00 (extended evenings Mon-Thur as needed), Sat-Sun 10:00-1:00 °Providers come to see babies at [facility] °Accepting Medicaid for families of first-time babies and families with all children in the household age 3 and under. Must register with office prior to making appointment (M-F only). °[facility] °Henson, NP; Knapp, MD; Lalonde, MD; Tysinger, PA °[address] [place], [place] [address] °[phone] °Mon-Fri 8:00-5:00 °Babies seen by providers at [facility] °Does NOT accept Medicaid/Commercial Insurance Only °[facility] ([facility]  °Artis, MD; Barnes, MD; Bratton, MD; Coccaro, MD; Lockett Gardner, MD; Kramer, MD; Marshall, MD; Netherton, MD; Poleto, MD; Skinner, MD °[address] [place], [place]  [address] °[phone] °Mon-Fri 8:30-5:30, Sat (Oct.-Mar.) 9:00-1:00 °Babies seen by providers at [facility] °Accepting Medicaid ° °[place]  ([address]) °[facility]  °Reid, MD; Warner, MD °[address], [place], [place] [address] °[phone] °Mon-Fri 8:30-5:00, Sat 8:30-12:00 °Providers come to see babies at [facility] °Does NOT accept Medicaid °[facility] °Becker, PA; Hagler, MD; Scifres, PA; Sun, MD; Swayne, MD °[address], [place], [place] [address] °[phone] °Mon-Fri 8:00-5:00 °Babies seen by providers at [facility] °Does NOT accept Medicaid °Only accepting babies of parents who are patients °Please call early in hospitalization for appointment (limited availability) °[facility] °Clark, MD; Frye, MD; Kelleher, MD; Mack, NP; Miller, MD; O'Keller, MD; Patterson, NP; Pudlo, MD; Puzio, MD; Thomas, MD; Tucker, MD; Twiselton, MD °[address], [place], [place] [address] °[phone] °Mon-Fri 8:00-5:00, Sat 9:00-12:00 °Providers come to see babies at [facility] °Does NOT accept Medicaid ° °[place] ([address]) °[facility] °Limited providers accepting new patients: Brake, NP; Wharton, PA °[address], [place], [place] [address] °[phone] °Mon-Fri 8:00-5:00 °Babies seen by providers at [facility] °Does NOT accept Medicaid °Only accepting babies of parents who are patients °Please call early in hospitalization for appointment (limited availability) °[facility] °Gay, MD; Quinlan, MD °[address] [place], [place] [address] °[phone] (press 1 to schedule appointment) °Mon-Fri 8:00-5:00 °Providers come to see babies at [facility] °Does NOT accept Medicaid °[facility] °Mazer, MD °[address] [place], [place] [address] °[phone] °Mon-Fri 8:30-5:00 (lunch 12:30-1:00), extended hours by appointment only Wed  5:00-6:30 °Babies seen by [facility] providers °Accepting Medicaid °[facility] °Banks, MD; Jordan, MD; Koberlein, MD °[address], [place], [place] [address] °[phone] °Mon-Fri 8:00-5:00 °Babies seen by [facility] providers °Does NOT accept Medicaid °[facility] °Parker, MD; Hunter, MD; Wallace, DO °[address] [place], [place] [address] °[phone] °Mon-Fri 8:00-5:00 °Babies seen by [facility] providers °Does NOT accept Medicaid °[facility] °Brandon, PA; Brecken, PA; Christy, NP; Dees, MD; DeClaire, MD; DeWeese, MD; Hansen, NP; Mills, NP; Parrish, NP; Smoot, NP; Summer, MD; Vapne, MD °[address] [place], [place] [address] °[phone] °Mon-Fri 8:30-5:00, Sat 10:00-1:00 °Providers come to see babies at [facility] °Does NOT accept Medicaid °Free prenatal information session Tuesdays at 4:45pm °[facility] °Bouska, MD; Gordon, PA; Jeffery, PA; Weber, PA °[address] [place] [address] °[phone] °Mon-Fri 7:30-5:30 °Babies seen by [facility] providers °[facility] °[address], [place], [place]  [address] °[phone]   Fax - [phone] ° °[place] ([address]) °[facility] °Reese, MD °[address] [place], [place] [address] °[phone] °Mon-Thur 8:00-6:00 °Providers come to see babies at [facility] °Accepting Medicaid °[facility] °Anderson, NP; Badger, MD; Beal, PA; [place], PA °[address] [place], [place] [address] °[phone] °Mon-Thur 7:30-7:30, Fri 7:30-4:30 °Babies seen by [facility] providers °Accepting Medicaid °[facility] °Agbuya, MD; Klett, NP; Romgoolam, MD °[address], [place], [place] [address] °[phone] °Mon-Fri 8:30-5:00, Sat 8:30-12:00 °Providers come to see babies at [facility] °Accepting Medicaid °Must  have “Meet & Greet” appointment at office prior to delivery °[facility] ([facility]) °McCord, MD; Wallace, MD; Wood, MD °[address], [place], [place] [address] °[phone] °Mon-Wed 8:00-6:00, Thur-Fri 8:00-5:00, Sat 9:00-12:00 °Providers come to see babies at [facility] °Does NOT accept Medicaid °Only accepting siblings of current patients °[facility]  °[address], , [place]  [address] °[phone]   Fax - [phone] °[facility] °[address], [place], [place]  [address] °[phone]   Fax - [phone] ° °[place] ([address]) °[facility] °Cirigliano, DO; Matthews, DO °[address] [place], [place] [address] °[phone] °Mon-Fri 7:00-5:00 °Babies seen by [facility] providers °Does NOT accept Medicaid °[facility] °Briscoe, MD; Howley, PA; Moreira, PA °[address], [place], [place] [address] °[phone] °Mon-Fri 8:00-5:00 °Babies seen by [facility] providers °Accepting Medicaid °[facility] °Boyd, MD; Church, PA; Jones, NP; [place], PA °[address], [place], [place] [address] °[phone] °Mon-Fri 8:00-5:00 °Babies seen by providers at [facility] °Accepting Medicaid ° °[place] ([address]) °[facility] [place] °Wendling, DO °[address] [place], [place] [address] °[phone] °Mon-Fri 8:00-5:00 °Babies seen by [facility] providers °Does NOT accept Medicaid °Limited availability, please call early in hospitalization to schedule follow-up °[facility] °Calderon, PA; Cummings, MD; Dillard, MD; Martin, PA; Olson, MD; VanDeven, PA °[address], [place], [place] [address] °[phone] °Mon-Fri 8:30-5:00, Sat 9:00-12:00 °Babies  seen by providers at [facility] °Accepting Medicaid °Please register online then schedule online or call office °[email or web address] °[facility] ([facility]) °Hunter, NP; Kumar, MD; Martin Rogers, PA °[address], [place], [place] [address] °[phone] °Mon-Fri 8:00-5:00 °Babies seen by providers at [facility] °Accepting Medicaid °[facility] ([facility]) °Duchesne, MD; Kristi Fleenor, NP; West, MD °[address], [place], [place] [address] °[phone] °Mon-Fri 8:00-5:30, Sat&Sun by appointment (phones open at 8:30) °Babies seen by [facility] providers °Accepting Medicaid °Must be a first-time baby or sibling of current patient °[facility] [place]  °[address], [place], [place]  [address] °[phone]   Fax - [phone] ° °[place] ([address]) °[facility] °Brown, PA; Cowen, PA; Rice, MD; Helton, PA; Spry, MD °[address] [place], [place] [address] °[phone] °Mon-Thur 8:00-7:00, Fri 8:00-5:00, Sat 8:00-12:00, Sun 9:00-12:00 °Babies seen by [facility] providers °Accepting Medicaid °[facility] °Coe-Goins, MD; Marshall, MD; Pierre-Louis, MD °[address], [place], [place] [address] °[phone] °Mon-Thur 8:00-5:00 °Babies seen by providers at [facility] °Accepting Medicaid °[facility] °Bratton, MD; Coe-Goins, MD; Hayes, MD; Lewis, MD; List, MD; Lott, MD; Marshall, MD; Moran, MD; O'Neal, MD; Pierre-Louis, MD; Pitonzo, MD; Scholer, MD; Spangle, MD °[address] [place], [place] [address] °[phone] °Mon-Fri 8:00-5:30, Sat (Oct.-Mar.) 9:00-1:00 °Babies seen by providers at [facility] °Accepting Medicaid °Must fill out new patient packet, available online at  [email or web address] °[facility] ([facility]) °Friddle, NP; Harris, NP; Kelly, NP; [place], MD; Melvin, PA; Poth, MD; Ramadoss, MD; Stanton, NP °[address], [place], [place] [address] °[phone] °Mon-Thur 8:00-5:30, Fri 8:00-5:00 °Babies seen by providers at [facility] °Accepting Medicaid ° °[place] ([address]) °[facility] °Dixon, PA; , MD; Pickard, MD; Tapia, PA °[address], [place], [place] [address] °[phone] °Mon-Fri 8:00-5:00 °Babies seen by providers at [facility] °Accepting Medicaid  ° °[place] ([address]) °[facility] [place] °Masneri, DO; Meyers, MD; Nelson, PA °[address], [place], [place] [address] °[phone] °Mon-Fri 8:00-5:00 °Babies seen by providers at [facility] °Does NOT accept   Medicaid °Limited appointment availability, please call early in hospitalization ° °[facility] [place] °Kunedd, DO; McGowen, MD °[address], [place], [place] [address] °[phone] °Mon-Fri 8:00-5:00 °Babies seen by [facility] providers °Does NOT accept Medicaid °[facility] [place] °Cameron, MD; MacDonald, MD; Michaels, PA; Nayak, MD °[address] [place] [address], [place], [place] [address] °[phone] °Mon-Fri 8:00-5:00 °After hours clinic ([address] [place], [place] [address]) [phone] Mon-Fri 5:00-8:00, Sat 12:00-6:00, Sun 10:00-4:00 °Babies seen by [facility] providers °Accepting Medicaid °[facility] [place] °[address], [place], [place]  [address] °[phone]   Fax - [phone] ° °[place] ([address]) °[facility] [place] [facility] °Andy, MD °[address], [place], [place] [address] °[phone] °Mon-Fri 8:00-5:00 °Babies seen by [facility] providers °Does NOT accept Medicaid °[facility] [place] ([facility]  [place]) °Eksir, MD °[address], [place], [place] [address] °[phone] °Mon-Thur 8:00-7:00, Fri 8:00-5:00, Sat 8:00-12:00 °Babies seen by providers at [facility] °Accepting Medicaid - but does not have vaccinations in office (must be received elsewhere) °Limited availability, please call early in hospitalization ° °[place] ([address]) °[facility]  °Charlene Flemming, MD °[address], [place] [address] °[phone]  Fax [phone] ° °[place] °[facility]  °[facility]  °Kimberly Newton, MD, Annamarie Streilein, PA, Carla Hampton, PA °[address] [place] °[place], [place] [address] °[phone] °[facility]  °[address], [place], [place] [address] °[phone] °[address], [place], [place] [address] °[phone] (West Office)  °[facility] °[address], [place], [place] [address] °[phone] °[facility] °[address], [place], [place] [address] °[phone] °[facility] °[address], [place], [place] [address] °[phone] °[facility] °[address], [place], [place] [address] °[phone] °[facility] °[address], [place], [place] [address] °[phone] °[facility] °[address], [place], [place] [address] °[phone] °[facility]  °[address], [place], [place] [address] °[phone] °Dr. Robert W. Little °[address], [place], [place] [address] [phone] °[facility] °[address], [place], [place] [address] °[phone] °[facility] °[address], [place], [place] [address] °[phone] ° °

## 2021-11-08 NOTE — Progress Notes (Signed)
   PRENATAL VISIT NOTE  Subjective:  Jenna Gonzalez is a 36 y.o. G1P0000 at [redacted]w[redacted]d being seen today for ongoing prenatal care.  She is currently monitored for the following issues for this low-risk pregnancy and has Supervision of other normal pregnancy, antepartum on their problem list.  Patient reports no complaints.  Contractions: Not present. Vag. Bleeding: None.  Movement: Present. Denies leaking of fluid.   The following portions of the patient's history were reviewed and updated as appropriate: allergies, current medications, past family history, past medical history, past social history, past surgical history and problem list.   Objective:   Vitals:   11/08/21 0807  BP: 110/68  Pulse: 74  Weight: 161 lb (73 kg)    Fetal Status: Fetal Heart Rate (bpm): 144 Fundal Height: 27 cm Movement: Present     General:  Alert, oriented and cooperative. Patient is in no acute distress.  Skin: Skin is warm and dry. No rash noted.   Cardiovascular: Normal heart rate noted  Respiratory: Normal respiratory effort, no problems with respiration noted  Abdomen: Soft, gravid, appropriate for gestational age.  Pain/Pressure: Absent     Pelvic: Cervical exam deferred        Extremities: Normal range of motion.  Edema: None  Mental Status: Normal mood and affect. Normal behavior. Normal judgment and thought content.   Assessment and Plan:  Pregnancy: G1P0000 at [redacted]w[redacted]d 1. Supervision of other normal pregnancy, antepartum - CBC - Glucose Tolerance, 2 Hours w/1 Hour - HIV Antibody (routine testing w rflx) - RPR - Tdap vaccine greater than or equal to 7yo IM  2. [redacted] weeks gestation of pregnancy   Preterm labor symptoms and general obstetric precautions including but not limited to vaginal bleeding, contractions, leaking of fluid and fetal movement were reviewed in detail with the patient. Please refer to After Visit Summary for other counseling recommendations.   Return in about 2 weeks (around  11/22/2021) for LOB, In-Person, any provider.  Future Appointments  Date Time Provider [place]  11/08/2021 10:35 AM Luvenia Redden, PA-C [facility] None  12/13/2021 10:35 AM Gavin Pound, CNM [facility] None  12/29/2021  4:10 PM Truett Mainland, DO [facility] None    Kerry Hough, PA-C

## 2021-11-09 LAB — RPR: RPR Ser Ql: NONREACTIVE

## 2021-11-09 LAB — GLUCOSE TOLERANCE, 2 HOURS W/ 1HR
Glucose, 1 hour: 141 mg/dL (ref 70–179)
Glucose, 2 hour: 143 mg/dL (ref 70–152)
Glucose, Fasting: 68 mg/dL — ABNORMAL LOW (ref 70–91)

## 2021-11-09 LAB — CBC
Hematocrit: 34.5 % (ref 34.0–46.6)
Hemoglobin: 11.4 g/dL (ref 11.1–15.9)
MCH: 29.5 pg (ref 26.6–33.0)
MCHC: 33 g/dL (ref 31.5–35.7)
MCV: 89 fL (ref 79–97)
Platelets: 277 10*3/uL (ref 150–450)
RBC: 3.86 x10E6/uL (ref 3.77–5.28)
RDW: 12.1 % (ref 11.7–15.4)
WBC: 9.9 10*3/uL (ref 3.4–10.8)

## 2021-11-09 LAB — HIV ANTIBODY (ROUTINE TESTING W REFLEX): HIV Screen 4th Generation wRfx: NONREACTIVE

## 2021-11-16 ENCOUNTER — Encounter: Payer: No Typology Code available for payment source | Admitting: Obstetrics & Gynecology

## 2021-12-13 ENCOUNTER — Ambulatory Visit (INDEPENDENT_AMBULATORY_CARE_PROVIDER_SITE_OTHER): Payer: No Typology Code available for payment source

## 2021-12-13 VITALS — BP 109/70 | HR 89 | Wt 169.0 lb

## 2021-12-13 DIAGNOSIS — Z3483 Encounter for supervision of other normal pregnancy, third trimester: Secondary | ICD-10-CM

## 2021-12-13 DIAGNOSIS — O26893 Other specified pregnancy related conditions, third trimester: Secondary | ICD-10-CM

## 2021-12-13 DIAGNOSIS — Z3480 Encounter for supervision of other normal pregnancy, unspecified trimester: Secondary | ICD-10-CM

## 2021-12-13 DIAGNOSIS — R102 Pelvic and perineal pain: Secondary | ICD-10-CM

## 2021-12-13 DIAGNOSIS — Z3A32 32 weeks gestation of pregnancy: Secondary | ICD-10-CM

## 2021-12-13 NOTE — Progress Notes (Signed)
   LOW-RISK PREGNANCY OFFICE VISIT  Patient name: Jenna Gonzalez MRN [id number]  Date of birth: Mar 10, 1985 Chief Complaint:   No chief complaint on file.  Subjective:   Jenna Gonzalez is a 36 y.o. G1P0000 female at [redacted]w[redacted]d with an Estimated Date of Delivery: 02/06/22 being seen today for ongoing management of a low-risk pregnancy aeb has Supervision of other normal pregnancy, antepartum on their problem list.  Patient presents today, alone, with no complaints.  However, patient with concerns regarding BH contractions and pelvic pressure as well as questions regarding IOL. Patient endorses fetal movement. Patient denies abdominal cramping or contractions.  Patient denies vaginal concerns including abnormal discharge, leaking of fluid, and bleeding. No issues with urination, constipation, or diarrhea.    Contractions: Not present. Vag. Bleeding: None.  Movement: Present.  Reviewed past medical,surgical, social, obstetrical and family history as well as problem list, medications and allergies.  Objective   Vitals:   12/13/21 1032  BP: 109/70  Pulse: 89  Weight: 169 lb (76.7 kg)  Body mass index is 28.12 kg/m.  Total Weight Gain:24 lb (10.9 kg)         Physical Examination:   General appearance: Well appearing, and in no distress  Mental status: Alert, oriented to person, place, and time  Skin: Warm & dry  Cardiovascular: Normal heart rate noted  Respiratory: Normal respiratory effort, no distress  Abdomen: Soft, gravid, nontender, AGA with Fundal Height: 34 cm  Pelvic: Cervical exam deferred           Extremities: Edema: None  Fetal Status: Fetal Heart Rate (bpm): 150  Movement: Present   No results found for this or any previous visit (from the past 24 hour(s)).  Assessment & Plan:  Low-risk pregnancy of a 36 y.o., G1P0000 at [redacted]w[redacted]d with an Estimated Date of Delivery: 02/06/22    1. Supervision of other normal pregnancy, antepartum -Anticipatory guidance for upcoming  appts. -Patient to schedule next appt in 2 weeks for an in-person visit. -Discussed IOL at 40+ weeks.  Informed that primigravidas are usually induced closer to 41 weeks to allow for the body to labor spontaneously.   2. [redacted] weeks gestation of pregnancy -Doing well. -Addressed concerns regarding BH contractions. -Educated on contraction pattern and when to report for evaluation in late third trimester. -Discussed fetal movement and when to report for evaluation.   3. Pelvic pressure in pregnancy, antepartum, third trimester -Reviewed complaint. -Reassured of normal occurrence in pregnancy esp in third trimester. -Discussed relief measures and cautioned that symptoms may worsen as pregnancy progresses.      Meds: No orders of the defined types were placed in this encounter.  Labs/procedures today:  Lab Orders  No laboratory test(s) ordered today     Reviewed: Preterm labor symptoms and general obstetric precautions including but not limited to vaginal bleeding, contractions, leaking of fluid and fetal movement were reviewed in detail with the patient.  All questions were answered.  Follow-up: No follow-ups on file.  No orders of the defined types were placed in this encounter.  Cherre Robins MSN, CNM 12/13/2021

## 2021-12-16 ENCOUNTER — Encounter: Payer: Self-pay | Admitting: General Practice

## 2021-12-29 ENCOUNTER — Ambulatory Visit (INDEPENDENT_AMBULATORY_CARE_PROVIDER_SITE_OTHER): Payer: No Typology Code available for payment source | Admitting: Family Medicine

## 2021-12-29 VITALS — BP 129/71 | HR 79 | Wt 173.0 lb

## 2021-12-29 DIAGNOSIS — Z3A34 34 weeks gestation of pregnancy: Secondary | ICD-10-CM

## 2021-12-29 DIAGNOSIS — Z3480 Encounter for supervision of other normal pregnancy, unspecified trimester: Secondary | ICD-10-CM

## 2021-12-29 DIAGNOSIS — F419 Anxiety disorder, unspecified: Secondary | ICD-10-CM

## 2021-12-29 DIAGNOSIS — Z3483 Encounter for supervision of other normal pregnancy, third trimester: Secondary | ICD-10-CM

## 2021-12-29 NOTE — Progress Notes (Signed)
   PRENATAL VISIT NOTE  Subjective:  Jenna Gonzalez is a 36 y.o. G1P0000 at [redacted]w[redacted]d being seen today for ongoing prenatal care.  She is currently monitored for the following issues for this low-risk pregnancy and has Supervision of other normal pregnancy, antepartum and Anxiety on their problem list.  Patient reports  had increased anxiety occasionally - had elevated GAD 7. On effexor for this. Improved now .  Contractions: Regular. Vag. Bleeding: None.  Movement: Present. Denies leaking of fluid.   The following portions of the patient's history were reviewed and updated as appropriate: allergies, current medications, past family history, past medical history, past social history, past surgical history and problem list.   Objective:   Vitals:   12/29/21 1612  BP: 129/71  Pulse: 79  Weight: 173 lb (78.5 kg)    Fetal Status: Fetal Heart Rate (bpm): 177 (recheck 146  Simultaneous filing. User may not have seen previous data.)   Movement: Present     General:  Alert, oriented and cooperative. Patient is in no acute distress.  Skin: Skin is warm and dry. No rash noted.   Cardiovascular: Normal heart rate noted  Respiratory: Normal respiratory effort, no problems with respiration noted  Abdomen: Soft, gravid, appropriate for gestational age.  Pain/Pressure: Present     Pelvic: Cervical exam deferred        Extremities: Normal range of motion.  Edema: None  Mental Status: Normal mood and affect. Normal behavior. Normal judgment and thought content.   Assessment and Plan:  Pregnancy: G1P0000 at [redacted]w[redacted]d 1. [redacted] weeks gestation of pregnancy  2. Supervision of other normal pregnancy, antepartum FHT and FH normal  3. Anxiety On effexor 75mg  daily. We discussed potentially increasing dose of effexor. At the present, she would like to hold off. Discussed close follow up postpartum. Will continue to check in subsequent appointments.  Preterm labor symptoms and general obstetric precautions  including but not limited to vaginal bleeding, contractions, leaking of fluid and fetal movement were reviewed in detail with the patient. Please refer to After Visit Summary for other counseling recommendations.   No follow-ups on file.  Future Appointments  Date Time Provider Department Center  01/12/2022  9:55 AM 03/13/2022, DO [facility] None  01/19/2022  3:10 PM 01/21/2022, DO [facility] None  01/25/2022  8:55 AM 01/27/2022, DO [facility] None  02/02/2022  1:30 PM 04/03/2022, DO [facility] None    Levie Heritage, DO

## 2022-01-02 NOTE — L&D Delivery Note (Addendum)
Pt presented for spontaneous labor, was augmented with AROM and Pitocin, progressed to complete and pushed for a total time of ~3h63m  Delivery Note At 1342 a viable female infant was delivered via SVD, presentation: LOA. APGAR: 5, 5, 5 (per nursing). Weight pending. Infant initially had good color, HR, and tone and placed on mothers abdomen. After ~5 min infant was showing irregular respiratory effort and was taken to warmer, code apgar was called. NICU staff to bedside for management.  Placenta status: spontaneously delivered intact with gentle cord traction. Fundus firm with massage and Pitocin.   Anesthesia: epidural Lacerations: 2nd degree perineal Suture used for repair: 2-0 Vicryl rapide Est. Blood Loss (mL): 715 Placenta to LD Complications prolonged second stage Cord ph n/a   Mom to postpartum. Baby to Couplet care / Skin to Skin.    Julianne Handler, CNM 01/27/2022 2:16 PM

## 2022-01-12 ENCOUNTER — Other Ambulatory Visit (HOSPITAL_COMMUNITY)
Admission: RE | Admit: 2022-01-12 | Discharge: 2022-01-12 | Disposition: A | Discharge status: Home / Self Care | Payer: 59 | Source: Ambulatory Visit | Attending: Family Medicine | Admitting: Family Medicine

## 2022-01-12 ENCOUNTER — Ambulatory Visit (INDEPENDENT_AMBULATORY_CARE_PROVIDER_SITE_OTHER): Payer: 59 | Admitting: Family Medicine

## 2022-01-12 VITALS — BP 123/79 | HR 96 | Wt 175.0 lb

## 2022-01-12 DIAGNOSIS — Z3483 Encounter for supervision of other normal pregnancy, third trimester: Secondary | ICD-10-CM | POA: Diagnosis present

## 2022-01-12 DIAGNOSIS — Z3A36 36 weeks gestation of pregnancy: Secondary | ICD-10-CM | POA: Diagnosis not present

## 2022-01-12 DIAGNOSIS — Z3480 Encounter for supervision of other normal pregnancy, unspecified trimester: Secondary | ICD-10-CM

## 2022-01-12 DIAGNOSIS — O26893 Other specified pregnancy related conditions, third trimester: Secondary | ICD-10-CM | POA: Insufficient documentation

## 2022-01-12 DIAGNOSIS — Z3403 Encounter for supervision of normal first pregnancy, third trimester: Secondary | ICD-10-CM | POA: Diagnosis not present

## 2022-01-12 NOTE — Progress Notes (Signed)
   PRENATAL VISIT NOTE  Subjective:  Jenna Gonzalez is a 37 y.o. G1P0000 at [redacted]w[redacted]d being seen today for ongoing prenatal care.  She is currently monitored for the following issues for this low-risk pregnancy and has Supervision of other normal pregnancy, antepartum and Anxiety on their problem list.  Patient reports occasional contractions.  Contractions: Irritability. Vag. Bleeding: None.  Movement: Present. Denies leaking of fluid.   The following portions of the patient's history were reviewed and updated as appropriate: allergies, current medications, past family history, past medical history, past social history, past surgical history and problem list.   Objective:   Vitals:   01/12/22 0953  BP: 123/79  Pulse: 96  Weight: 175 lb (79.4 kg)    Fetal Status: Fetal Heart Rate (bpm): 157   Movement: Present     General:  Alert, oriented and cooperative. Patient is in no acute distress.  Skin: Skin is warm and dry. No rash noted.   Cardiovascular: Normal heart rate noted  Respiratory: Normal respiratory effort, no problems with respiration noted  Abdomen: Soft, gravid, appropriate for gestational age.  Pain/Pressure: Present     Pelvic: Cervical exam performed in the presence of a chaperone        Extremities: Normal range of motion.  Edema: None  Mental Status: Normal mood and affect. Normal behavior. Normal judgment and thought content.   Assessment and Plan:  Pregnancy: G1P0000 at [redacted]w[redacted]d 1. [redacted] weeks gestation of pregnancy - Culture, beta strep (group b only) - GC/Chlamydia probe amp ([place])not at [facility]  2. Supervision of other normal pregnancy, antepartum FHT and FH normal - Culture, beta strep (group b only) - GC/Chlamydia probe amp ([place])not at [facility]  Term labor symptoms and general obstetric precautions including but not limited to vaginal bleeding, contractions, leaking of fluid and fetal movement were reviewed in detail with the patient. Please refer to  After Visit Summary for other counseling recommendations.   No follow-ups on file.  Future Appointments  Date Time Provider [place]  01/19/2022  3:10 PM Truett Mainland, DO [facility] None  01/25/2022  8:55 AM Truett Mainland, DO [facility] None  02/02/2022  1:30 PM Truett Mainland, DO [facility] None    Truett Mainland, DO

## 2022-01-13 LAB — GC/CHLAMYDIA PROBE AMP (~~LOC~~) NOT AT ARMC
Chlamydia: NEGATIVE
Comment: NEGATIVE
Comment: NORMAL
Neisseria Gonorrhea: NEGATIVE

## 2022-01-16 LAB — CULTURE, BETA STREP (GROUP B ONLY): Strep Gp B Culture: NEGATIVE

## 2022-01-19 ENCOUNTER — Ambulatory Visit (INDEPENDENT_AMBULATORY_CARE_PROVIDER_SITE_OTHER): Payer: 59 | Admitting: Family Medicine

## 2022-01-19 VITALS — BP 129/80 | HR 90 | Wt 178.0 lb

## 2022-01-19 DIAGNOSIS — Z3403 Encounter for supervision of normal first pregnancy, third trimester: Secondary | ICD-10-CM

## 2022-01-19 DIAGNOSIS — M546 Pain in thoracic spine: Secondary | ICD-10-CM

## 2022-01-19 DIAGNOSIS — Z3480 Encounter for supervision of other normal pregnancy, unspecified trimester: Secondary | ICD-10-CM

## 2022-01-19 DIAGNOSIS — M9902 Segmental and somatic dysfunction of thoracic region: Secondary | ICD-10-CM | POA: Diagnosis not present

## 2022-01-19 DIAGNOSIS — Z3A37 37 weeks gestation of pregnancy: Secondary | ICD-10-CM | POA: Diagnosis not present

## 2022-01-19 NOTE — Progress Notes (Signed)
   PRENATAL VISIT NOTE  Subjective:  Jenna Gonzalez is a 37 y.o. G1P0000 at [redacted]w[redacted]d being seen today for ongoing prenatal care.  She is currently monitored for the following issues for this low-risk pregnancy and has Supervision of other normal pregnancy, antepartum and Anxiety on their problem list.  Patient reports backache in the upper back.  Contractions: Not present. Vag. Bleeding: None.  Movement: Present. Denies leaking of fluid.   The following portions of the patient's history were reviewed and updated as appropriate: allergies, current medications, past family history, past medical history, past social history, past surgical history and problem list.   Objective:   Vitals:   01/19/22 1511 01/19/22 1516  BP: 138/83 129/80  Pulse: 88 90  Weight: 178 lb (80.7 kg)     Fetal Status: Fetal Heart Rate (bpm): 145 Fundal Height: 37 cm Movement: Present  Presentation: Vertex  General:  Alert, oriented and cooperative. Patient is in no acute distress.  Skin: Skin is warm and dry. No rash noted.   Cardiovascular: Normal heart rate noted  Respiratory: Normal respiratory effort, no problems with respiration noted  Abdomen: Soft, gravid, appropriate for gestational age.  Pain/Pressure: Present ("lots of pressure")     Pelvic: Cervical exam deferred        Extremities: Normal range of motion.  Edema: Trace  Mental Status: Normal mood and affect. Normal behavior. Normal judgment and thought content.   MSK: Restriction, tenderness, tissue texture changes, and paraspinal spasm in the thoracic spine  Neuro: Moves all four extremities with no focal neurological deficit   OSE: Head   Cervical   Thoracic T4, T8 FSRR  Rib   Lumbar   Sacrum   Pelvis      Assessment and Plan:  Pregnancy: G1P0000 at [redacted]w[redacted]d 1. [redacted] weeks gestation of pregnancy  2. Supervision of other normal pregnancy, antepartum FHT and FH normal  3. Acute left-sided thoracic back pain 4. Somatic dysfunction of spine,  thoracic OMT done after patient permission. HVLA technique utilized. 1 areas treated with improvement of tissue texture and joint mobility. Patient tolerated procedure well.     Preterm labor symptoms and general obstetric precautions including but not limited to vaginal bleeding, contractions, leaking of fluid and fetal movement were reviewed in detail with the patient. Please refer to After Visit Summary for other counseling recommendations.   No follow-ups on file.  Future Appointments  Date Time Provider [place]  01/25/2022  8:55 AM Truett Mainland, DO [facility] None  02/02/2022  1:30 PM Truett Mainland, DO [facility] None    Truett Mainland, DO

## 2022-01-25 ENCOUNTER — Ambulatory Visit (INDEPENDENT_AMBULATORY_CARE_PROVIDER_SITE_OTHER): Payer: 59 | Admitting: Family Medicine

## 2022-01-25 ENCOUNTER — Inpatient Hospital Stay (HOSPITAL_COMMUNITY)
Admission: AD | Admit: 2022-01-25 | Discharge: 2022-01-26 | Disposition: A | Discharge status: Home / Self Care | Payer: 59 | Source: Home / Self Care | Attending: Obstetrics and Gynecology | Admitting: Obstetrics and Gynecology

## 2022-01-25 ENCOUNTER — Other Ambulatory Visit: Payer: Self-pay

## 2022-01-25 ENCOUNTER — Encounter (HOSPITAL_COMMUNITY): Payer: Self-pay | Admitting: Obstetrics and Gynecology

## 2022-01-25 VITALS — BP 120/82 | HR 111 | Wt 178.0 lb

## 2022-01-25 DIAGNOSIS — Z3A38 38 weeks gestation of pregnancy: Secondary | ICD-10-CM | POA: Insufficient documentation

## 2022-01-25 DIAGNOSIS — O471 False labor at or after 37 completed weeks of gestation: Secondary | ICD-10-CM

## 2022-01-25 DIAGNOSIS — Z3403 Encounter for supervision of normal first pregnancy, third trimester: Secondary | ICD-10-CM

## 2022-01-25 DIAGNOSIS — Z3480 Encounter for supervision of other normal pregnancy, unspecified trimester: Secondary | ICD-10-CM

## 2022-01-25 HISTORY — DX: Other specified health status: Z78.9

## 2022-01-25 NOTE — MAU Note (Signed)
.  Jenna Gonzalez is a 37 y.o. at [redacted]w[redacted]d here in MAU reporting: ctx started yesterday - was irregular but have been 5 minutes apart since noon today. SVE in office this morning was 1.5cm. denies VB or LOF - some spotting since SVE. +FM  Onset of complaint: noon Pain score: 7 Vitals:   01/25/22 2202  BP: 126/81  Pulse: 87  Resp: 20  Temp: (!) 97.4 F (36.3 C)  SpO2: 99%     FHT:143 Lab orders placed from triage:  labor eval

## 2022-01-25 NOTE — Progress Notes (Shared)
   S: Jenna Gonzalez is a 37 y.o. G1P0000 at [redacted]w[redacted]d  who presents to MAU today complaining contractions q 5 minutes since yesterday. She denies vaginal bleeding. She denies LOF. She reports normal fetal movement.    O: BP 128/78   Pulse 86   Temp (!) 97.4 F (36.3 C) (Oral)   Resp 20   Ht 5\' 5"  (1.651 m)   Wt 80.3 kg   LMP 05/02/2021   SpO2 96%   BMI 29.45 kg/m  GENERAL: Well-developed, well-nourished female in no acute distress.  HEAD: Normocephalic, atraumatic.  CHEST: Normal effort of breathing, regular heart rate ABDOMEN: Soft, nontender, gravid  Cervical exam:  Dilation: 1.5 Effacement (%): 60 Cervical Position: Middle Station: -2 Presentation: Vertex Exam by:: Huston Foley RN   Fetal Monitoring: Baseline: *** Variability: *** Accelerations: *** Decelerations: *** Contractions: ***   A: SIUP at [redacted]w[redacted]d  False labor  P:   Abram Sander, MD 01/25/2022 11:13 PM

## 2022-01-25 NOTE — Progress Notes (Signed)
   PRENATAL VISIT NOTE  Subjective:  Jenna Gonzalez is a 37 y.o. G1P0000 at [redacted]w[redacted]d being seen today for ongoing prenatal care.  She is currently monitored for the following issues for this low-risk pregnancy and has Supervision of other normal pregnancy, antepartum and Anxiety on their problem list.  Patient reports no complaints.  Contractions: Irregular.  .  Movement: Present. Denies leaking of fluid.   The following portions of the patient's history were reviewed and updated as appropriate: allergies, current medications, past family history, past medical history, past social history, past surgical history and problem list.   Objective:   Vitals:   01/25/22 0853 01/25/22 0857  BP: (!) 136/93 120/82  Pulse: 100 (!) 111  Weight: 178 lb (80.7 kg)     Fetal Status: Fetal Heart Rate (bpm): 155 Fundal Height: 39 cm Movement: Present  Presentation: Vertex  General:  Alert, oriented and cooperative. Patient is in no acute distress.  Skin: Skin is warm and dry. No rash noted.   Cardiovascular: Normal heart rate noted  Respiratory: Normal respiratory effort, no problems with respiration noted  Abdomen: Soft, gravid, appropriate for gestational age.  Pain/Pressure: Present     Pelvic: Cervical exam performed in the presence of a chaperone Dilation: 1.5 Effacement (%): 50 Station: -2  Extremities: Normal range of motion.  Edema: None  Mental Status: Normal mood and affect. Normal behavior. Normal judgment and thought content.   Assessment and Plan:  Pregnancy: G1P0000 at [redacted]w[redacted]d 1. Supervision of other normal pregnancy, antepartum FHT and FHT normal. Schedule induction on 2/6 - she'll be [redacted]w[redacted]d.  Term labor symptoms and general obstetric precautions including but not limited to vaginal bleeding, contractions, leaking of fluid and fetal movement were reviewed in detail with the patient. Please refer to After Visit Summary for other counseling recommendations.   No follow-ups on  file.  Future Appointments  Date Time Provider [place]  02/02/2022  1:30 PM Truett Mainland, DO [facility] None    Truett Mainland, DO

## 2022-01-26 ENCOUNTER — Inpatient Hospital Stay (HOSPITAL_COMMUNITY): Payer: 59 | Admitting: Anesthesiology

## 2022-01-26 ENCOUNTER — Other Ambulatory Visit: Payer: Self-pay

## 2022-01-26 ENCOUNTER — Telehealth: Payer: Self-pay

## 2022-01-26 ENCOUNTER — Inpatient Hospital Stay (HOSPITAL_COMMUNITY)
Admission: AD | Admit: 2022-01-26 | Discharge: 2022-01-29 | DRG: 807 | Disposition: A | Discharge status: Home / Self Care | Payer: 59 | Attending: Obstetrics and Gynecology | Admitting: Obstetrics and Gynecology

## 2022-01-26 ENCOUNTER — Encounter (HOSPITAL_COMMUNITY): Payer: Self-pay | Admitting: Obstetrics & Gynecology

## 2022-01-26 DIAGNOSIS — Z3A38 38 weeks gestation of pregnancy: Secondary | ICD-10-CM | POA: Diagnosis not present

## 2022-01-26 DIAGNOSIS — O411230 Chorioamnionitis, third trimester, not applicable or unspecified: Principal | ICD-10-CM | POA: Diagnosis present

## 2022-01-26 DIAGNOSIS — F419 Anxiety disorder, unspecified: Secondary | ICD-10-CM | POA: Diagnosis not present

## 2022-01-26 DIAGNOSIS — O26893 Other specified pregnancy related conditions, third trimester: Secondary | ICD-10-CM | POA: Diagnosis present

## 2022-01-26 DIAGNOSIS — Z880 Allergy status to penicillin: Secondary | ICD-10-CM

## 2022-01-26 DIAGNOSIS — O99344 Other mental disorders complicating childbirth: Secondary | ICD-10-CM | POA: Diagnosis present

## 2022-01-26 HISTORY — DX: Urinary tract infection, site not specified: N39.0

## 2022-01-26 HISTORY — DX: Anxiety disorder, unspecified: F41.9

## 2022-01-26 LAB — TYPE AND SCREEN
ABO/RH(D): A POS
Antibody Screen: NEGATIVE

## 2022-01-26 LAB — CBC
HCT: 32.5 % — ABNORMAL LOW (ref 36.0–46.0)
Hemoglobin: 10.7 g/dL — ABNORMAL LOW (ref 12.0–15.0)
MCH: 25.8 pg — ABNORMAL LOW (ref 26.0–34.0)
MCHC: 32.9 g/dL (ref 30.0–36.0)
MCV: 78.5 fL — ABNORMAL LOW (ref 80.0–100.0)
Platelets: 304 10*3/uL (ref 150–400)
RBC: 4.14 MIL/uL (ref 3.87–5.11)
RDW: 14.3 % (ref 11.5–15.5)
WBC: 14 10*3/uL — ABNORMAL HIGH (ref 4.0–10.5)
nRBC: 0 % (ref 0.0–0.2)

## 2022-01-26 MED ORDER — OXYTOCIN BOLUS FROM INFUSION
333.0000 mL | Freq: Once | INTRAVENOUS | Status: AC
  Administered 2022-01-27: 333 mL via INTRAVENOUS

## 2022-01-26 MED ORDER — LIDOCAINE HCL (PF) 1 % IJ SOLN: 30.0000 mL | INTRAMUSCULAR | Status: DC | PRN

## 2022-01-26 MED ORDER — PROMETHAZINE HCL 25 MG/ML IJ SOLN
25.0000 mg | Freq: Four times a day (QID) | INTRAMUSCULAR | Status: DC | PRN
  Administered 2022-01-26: 25 mg via INTRAMUSCULAR
  Filled 2022-01-26 (×2): qty 1

## 2022-01-26 MED ORDER — SOD CITRATE-CITRIC ACID 500-334 MG/5ML PO SOLN: 30.0000 mL | ORAL | Status: DC | PRN

## 2022-01-26 MED ORDER — PHENYLEPHRINE 80 MCG/ML (10ML) SYRINGE FOR IV PUSH (FOR BLOOD PRESSURE SUPPORT)
80.0000 ug | PREFILLED_SYRINGE | INTRAVENOUS | Status: DC | PRN
  Administered 2022-01-26 (×2): 80 ug via INTRAVENOUS

## 2022-01-26 MED ORDER — FENTANYL-BUPIVACAINE-NACL 0.5-0.125-0.9 MG/250ML-% EP SOLN
12.0000 mL/h | EPIDURAL | Status: DC | PRN
  Administered 2022-01-26 – 2022-01-27 (×2): 12 mL/h via EPIDURAL
  Filled 2022-01-26 (×2): qty 250

## 2022-01-26 MED ORDER — ACETAMINOPHEN 325 MG PO TABS: 650.0000 mg | ORAL_TABLET | ORAL | Status: DC | PRN

## 2022-01-26 MED ORDER — OXYCODONE-ACETAMINOPHEN 5-325 MG PO TABS: 2.0000 | ORAL_TABLET | ORAL | Status: DC | PRN

## 2022-01-26 MED ORDER — PHENYLEPHRINE 80 MCG/ML (10ML) SYRINGE FOR IV PUSH (FOR BLOOD PRESSURE SUPPORT)
80.0000 ug | PREFILLED_SYRINGE | INTRAVENOUS | Status: DC | PRN
  Filled 2022-01-26 (×2): qty 10

## 2022-01-26 MED ORDER — OXYTOCIN-SODIUM CHLORIDE 30-0.9 UT/500ML-% IV SOLN
2.5000 [IU]/h | INTRAVENOUS | Status: DC
  Administered 2022-01-27: 2.5 [IU]/h via INTRAVENOUS
  Filled 2022-01-26: qty 500

## 2022-01-26 MED ORDER — EPHEDRINE 5 MG/ML INJ: 10.0000 mg | INTRAVENOUS | Status: DC | PRN

## 2022-01-26 MED ORDER — MORPHINE SULFATE (PF) 4 MG/ML IV SOLN
4.0000 mg | Freq: Once | INTRAVENOUS | Status: AC
  Administered 2022-01-26: 4 mg via INTRAMUSCULAR
  Filled 2022-01-26: qty 1

## 2022-01-26 MED ORDER — EPHEDRINE 5 MG/ML INJ
10.0000 mg | INTRAVENOUS | Status: DC | PRN
  Filled 2022-01-26: qty 5

## 2022-01-26 MED ORDER — ONDANSETRON HCL 4 MG/2ML IJ SOLN: 4.0000 mg | Freq: Four times a day (QID) | INTRAMUSCULAR | Status: DC | PRN

## 2022-01-26 MED ORDER — LACTATED RINGERS IV SOLN: INTRAVENOUS | Status: DC

## 2022-01-26 MED ORDER — VENLAFAXINE HCL ER 75 MG PO CP24
75.0000 mg | ORAL_CAPSULE | Freq: Every day | ORAL | Status: DC
  Administered 2022-01-27: 75 mg via ORAL
  Filled 2022-01-26: qty 1

## 2022-01-26 MED ORDER — LACTATED RINGERS IV SOLN: 500.0000 mL | Freq: Once | INTRAVENOUS | Status: DC

## 2022-01-26 MED ORDER — OXYCODONE-ACETAMINOPHEN 5-325 MG PO TABS: 1.0000 | ORAL_TABLET | ORAL | Status: DC | PRN

## 2022-01-26 MED ORDER — LACTATED RINGERS IV SOLN
500.0000 mL | INTRAVENOUS | Status: DC | PRN
  Administered 2022-01-26 – 2022-01-27 (×3): 500 mL via INTRAVENOUS

## 2022-01-26 MED ORDER — LIDOCAINE HCL (PF) 1 % IJ SOLN
INTRAMUSCULAR | Status: DC | PRN
  Administered 2022-01-26: 5 mL via EPIDURAL

## 2022-01-26 MED ORDER — OXYTOCIN-SODIUM CHLORIDE 30-0.9 UT/500ML-% IV SOLN
1.0000 m[IU]/min | INTRAVENOUS | Status: DC
  Administered 2022-01-26: 2 m[IU]/min via INTRAVENOUS
  Filled 2022-01-26: qty 500

## 2022-01-26 MED ORDER — DIPHENHYDRAMINE HCL 50 MG/ML IJ SOLN: 12.5000 mg | INTRAMUSCULAR | Status: DC | PRN

## 2022-01-26 MED ORDER — TERBUTALINE SULFATE 1 MG/ML IJ SOLN
0.2500 mg | Freq: Once | INTRAMUSCULAR | Status: DC | PRN
  Filled 2022-01-26: qty 1

## 2022-01-26 NOTE — MAU Note (Signed)
Called pharmacy regarding phenergan, 2nd call.  ? Tube system not working, someone will need to come get it.

## 2022-01-26 NOTE — MAU Provider Note (Signed)
   S: Ms. Jenna Gonzalez is a 37 y.o. G1P0000 at [redacted]w[redacted]d  who presents to MAU today complaining contractions q 5 minutes since yesterday. She denies vaginal bleeding. She denies LOF. She reports normal fetal movement.    O: BP 128/78   Pulse 86   Temp (!) 97.4 F (36.3 C) (Oral)   Resp 20   Ht 5\' 5"  (1.651 m)   Wt 80.3 kg   LMP 05/02/2021   SpO2 96%   BMI 29.45 kg/m  GENERAL: Well-developed, well-nourished female in no acute distress.  HEAD: Normocephalic, atraumatic.  CHEST: Normal effort of breathing, regular heart rate ABDOMEN: Soft, nontender, gravid  Cervical exam:  Dilation: 1.5 Effacement (%): 60 Cervical Position: Middle Station: -2 Presentation: Vertex Exam by:: Huston Foley RN   Fetal Monitoring: Baseline: intitially was having a baseline more around 170-180 that than normalized to 150 Variability: moderate Accelerations: present Decelerations: absent Contractions: irregular Discussed FHT with Dr. Elgie Congo prior to discharge  A: SIUP at [redacted]w[redacted]d, no cervical change has been made since when she was checked this morning. FHT overall reassuring.  False labor  P: Will have patient go home with return precautions provided.    Abram Sander, MD 01/25/2022 11:13 PM

## 2022-01-26 NOTE — Anesthesia Procedure Notes (Addendum)
Epidural Patient location during procedure: OB Start time: 01/26/2022 1:25 PM End time: 01/26/2022 1:40 PM  Staffing Anesthesiologist: Barnet Glasgow, MD Performed: anesthesiologist   Preanesthetic Checklist Completed: patient identified, IV checked, site marked, risks and benefits discussed, surgical consent, monitors and equipment checked, pre-op evaluation and timeout performed  Epidural Patient position: sitting Prep: DuraPrep and site prepped and draped Patient monitoring: continuous pulse ox and blood pressure Approach: midline Location: L3-L4 Injection technique: LOR air  Needle:  Needle type: Tuohy  Needle gauge: 17 G Needle length: 9 cm and 9 Needle insertion depth: 7 cm Catheter type: closed end flexible Catheter size: 19 Gauge Catheter at skin depth: 13 cm Test dose: negative  Assessment Events: blood not aspirated, no cerebrospinal fluid, injection not painful, no injection resistance, no paresthesia and negative IV test  Additional Notes Patient identified. Risks/Benefits/Options discussed with patient including but not limited to bleeding, infection, nerve damage, paralysis, failed block, incomplete pain control, headache, blood pressure changes, nausea, vomiting, reactions to medication both or allergic, itching and postpartum back pain. Confirmed with bedside nurse the patient's most recent platelet count. Confirmed with patient that they are not currently taking any anticoagulation, have any bleeding history or any family history of bleeding disorders. Patient expressed understanding and wished to proceed. All questions were answered. Sterile technique was used throughout the entire procedure. Please see nursing notes for vital signs. Test dose was given through epidural needle and negative prior to continuing to dose epidural or start infusion. Warning signs of high block given to the patient including shortness of breath, tingling/numbness in hands, complete  motor block, or any concerning symptoms with instructions to call for help. Patient was given instructions on fall risk and not to get out of bed. All questions and concerns addressed with instructions to call with any issues.  1 Attempt (S) . Patient tolerated procedure well.

## 2022-01-26 NOTE — H&P (Signed)
OBSTETRIC ADMISSION HISTORY AND PHYSICAL  Jenna Gonzalez is a 37 y.o. female G1P0000 with IUP at [redacted]w[redacted]d presenting for labor. She reports +FMs. No LOF, VB, blurry vision, headaches, peripheral edema, or RUQ pain. She plans on breast pumping. She requests POPs for birth control.  Dating: By LMP --->  Estimated Date of Delivery: 02/06/22  Sono:    @[redacted]w[redacted]d , normal anatomy, breech presentation, 606g, 56%ile, EFW 1'5  Prenatal History/Complications: - anxiety on Effexor  Past Medical History: Past Medical History:  Diagnosis Date   Anxiety    UTI (urinary tract infection)     Past Surgical History: Past Surgical History:  Procedure Laterality Date   APPENDECTOMY  01/01/2017    Obstetrical History: OB History     Gravida  1   Para  0   Term  0   Preterm  0   AB  0   Living  0      SAB  0   IAB  0   Ectopic  0   Multiple  0   Live Births  0           Social History: Social History   Socioeconomic History   Marital status: Married    Spouse name: Einar Pheasant   Number of children: Not on file   Years of education: Not on file   Highest education level: Master's degree (e.g., MA, MS, MEng, MEd, MSW, MBA)  Occupational History   Not on file  Tobacco Use   Smoking status: Never   Smokeless tobacco: Never  Vaping Use   Vaping Use: Never used  Substance and Sexual Activity   Alcohol use: Not Currently   Drug use: Never   Sexual activity: Yes  Other Topics Concern   Not on file  Social History Narrative   Not on file   Social Determinants of Health   Financial Resource Strain: Not on file  Food Insecurity: Not on file  Transportation Needs: Not on file  Physical Activity: Not on file  Stress: Not on file  Social Connections: Not on file    Family History: Family History  Problem Relation Age of Onset   Hypertension Mother    High Cholesterol Mother    Alcohol abuse Father     Allergies: Allergies  Allergen Reactions   Amoxicillin Hives  and Rash   Metronidazole Nausea Only and Other (See Comments)    Heart raced    Medications Prior to Admission  Medication Sig Dispense Refill Last Dose   Prenatal Vit-Fe Fumarate-FA (PRENATAL VITAMINS PO) Take by mouth.   01/25/2022   venlafaxine XR (EFFEXOR XR) 75 MG 24 hr capsule Take 1 capsule (75 mg total) by mouth daily with breakfast. 90 capsule 3 01/25/2022     Review of Systems:  All systems reviewed and negative except as stated in HPI  PE: Blood pressure 125/82, pulse 74, temperature 97.7 F (36.5 C), temperature source Oral, resp. rate 20, height 5\' 5"  (1.651 m), weight 80.1 kg, last menstrual period 05/02/2021, SpO2 100 %. General appearance: alert, cooperative, and no distress Lungs: regular rate and effort Heart: regular rate  Abdomen: soft, non-tender Extremities: Homans sign is negative, no sign of DVT Presentation: cephalic per RN EFM: 151 bpm, mod variability, + accels, no decels Toco: q3-5 Dilation: 5 Effacement (%): 80 Station: -3, -2 Exam by:: jolynn  Prenatal labs: ABO, Rh: A/Positive/-- (07/19 0840) Antibody: Negative (07/19 0840) Rubella: 1.14 (07/19 0840) RPR: Non Reactive (11/07 0814)  HBsAg: Negative (07/19  0840)  HIV: Non Reactive (11/07 0814)  GBS: Negative/-- (01/11 1017)  2 hr GTT nml  Prenatal Transfer Tool  Maternal Diabetes: No Genetic Screening: Normal Maternal Ultrasounds/Referrals: Normal Fetal Ultrasounds or other Referrals:  None Maternal Substance Abuse:  No Significant Maternal Medications:  None Significant Maternal Lab Results: Group B Strep negative  No results found for this or any previous visit (from the past 24 hour(s)).  Patient Active Problem List   Diagnosis Date Noted   Anxiety 12/29/2021   Supervision of other normal pregnancy, antepartum 07/14/2021    Assessment: Jenna Gonzalez is a 37 y.o. G1P0000 at [redacted]w[redacted]d here for labor  1. Labor: early active 2. FWB: Cat I 3. Pain: analgesia/anesthesia 4. GBS:  neg   Plan: Admit to LD Expectant mngt Anticipate SVD  Julianne Handler, CNM  01/26/2022, 11:54 AM

## 2022-01-26 NOTE — Telephone Encounter (Signed)
Patient called stating she is still having contractions 5 minutes apart. She states she was seen in MAU and was sent home due to false labor.Patient states baby is moving well and denies leakage of fluids and vaginal bleeding. Advised patient to go back to MAU if contractions continue to be unbearable. Understanding was voiced. Saahir Prude l Luciana Cammarata, CMA

## 2022-01-26 NOTE — Progress Notes (Addendum)
Labor Progress Note FREDONIA CASALINO is a 37 y.o. G1P0000 at [redacted]w[redacted]d presented for labor  S:  Remains comfortable with epidural.  O:  BP 124/70   Pulse 78   Temp 98 F (36.7 C) (Oral)   Resp 20   Ht 5\' 5"  (1.651 m)   Wt 80.1 kg   LMP 05/02/2021   SpO2 99%   BMI 29.39 kg/m  EFM: baseline 130 bpm/ mod variability/ + accels/ no decels Toco: q4-6 mins SVE: Dilation: 6 Effacement (%): 80 Cervical Position: Posterior, Middle Station: -2 Presentation: Vertex Exam by:: Dr. Gwinda Maine   A/P: 37 y.o. G1P0000 [redacted]w[redacted]d  1. Labor: active, protracted. On pitocin will continue and titrate as needed. Exam unchanged from last check, however pt is contracting, will recheck in 2 hours and if no change will consider placing IUPC at next check to assess contraction strength.  2. FWB: Cat I 3. Pain: epidural   Anticipate SVD.  Abram Sander, MD 9:01 PM

## 2022-01-26 NOTE — Progress Notes (Signed)
Labor Progress Note CHASITIE PASSEY is a 37 y.o. G1P0000 at [redacted]w[redacted]d presented for labor  S:  Remains comfortable with epidural, but thinks she is feeling the contractions more  O:  BP (!) 102/55   Pulse 82   Temp 98 F (36.7 C) (Oral)   Resp 20   Ht 5\' 5"  (1.651 m)   Wt 80.1 kg   LMP 05/02/2021   SpO2 99%   BMI 29.39 kg/m  EFM: baseline 120 bpm/ mod variability/ + accels/ occasional varriables decels Toco: q4-6 mins SVE: Dilation: 7 Effacement (%): 90 Cervical Position: Posterior, Middle Station: -2 Presentation: Vertex Exam by:: Dr. Christin Bach   A/P: 37 y.o. G1P0000 [redacted]w[redacted]d  1. Labor: active, protracted. IUPC placed,on pitocin will continue and titrate as needed.  2. FWB: Cat I 3. Pain: epidural   Anticipate SVD.  Abram Sander, MD 11:41 PM

## 2022-01-26 NOTE — MAU Note (Addendum)
Jenna Gonzalez is a 37 y.o. at [redacted]w[redacted]d here in MAU reporting: contractions closer and stronger since when here last night.  Some spotting since exam.  No LOF.  Reports +FM  Onset of complaint: ongoing Pain score: 8 Vitals:   01/26/22 0920 01/26/22 0922  BP:  121/81  Pulse:  92  Resp:  20  Temp:  97.7 F (36.5 C)  SpO2: 100% 99%      Lab orders placed from triage:  taken directly to rm

## 2022-01-26 NOTE — Addendum Note (Signed)
Addended by: Truett Mainland on: 01/26/2022 03:20 PM   Modules accepted: Orders

## 2022-01-26 NOTE — Progress Notes (Signed)
Labor Progress Note Jenna Gonzalez is a 37 y.o. G1P0000 at [redacted]w[redacted]d presented for labor  S:  Comfortable with epidural.  O:  BP 111/84   Pulse 71   Temp 97.8 F (36.6 C) (Oral)   Resp 20   Ht 5\' 5"  (1.651 m)   Wt 80.1 kg   LMP 05/02/2021   SpO2 99%   BMI 29.39 kg/m  EFM: baseline 140 bpm/ mod variability/ + accels/ no decels  Toco/IUPC: 4-6 SVE: Dilation: 6 Effacement (%): 80 Cervical Position: Posterior, Middle Station: -1 Presentation: Vertex Exam by:: Jenna Gonzalez, CNM   A/P: 37 y.o. G1P0000 [redacted]w[redacted]d  1. Labor: active, protracted 2. FWB: Cat I 3. Pain: epidural  No cervical change, suspect ROT, exag sims. Recommend Pitocin, pt agrees. Anticipate SVD.  Jenna Gonzalez, CNM 6:38 PM

## 2022-01-26 NOTE — MAU Note (Signed)
Pt informed that the ultrasound is considered a limited OB ultrasound and is not intended to be a complete ultrasound exam.  Patient also informed that the ultrasound is not being completed with the intent of assessing for fetal or placental anomalies or any pelvic abnormalities.  Explained that the purpose of today's ultrasound is to assess for presentation.  Patient acknowledges the purpose of the exam and the limitations of the study.    Vertex presentation confirmed 

## 2022-01-26 NOTE — Progress Notes (Signed)
Labor Progress Note Jenna Gonzalez is a 37 y.o. G1P0000 at [redacted]w[redacted]d presented for labor  S:  Comfortable with epidural.   O:  BP (!) 72/43   Pulse (!) 110   Temp 98.6 F (37 C) (Oral)   Resp 20   Ht 5\' 5"  (1.651 m)   Wt 80.1 kg   LMP 05/02/2021   SpO2 99%   BMI 29.39 kg/m  EFM: baseline 135 bpm/ mod variability/ + accels/ no decels  Toco/IUPC: q7 SVE: Dilation: 6 Effacement (%): 80 Cervical Position: Posterior, Middle Station: -1 Presentation: Vertex Exam by:: Sheresa Cullop, CNM   A/P: 37 y.o. G1P0000 [redacted]w[redacted]d  1. Labor: active, slow progress 2. FWB: Cat I 3. Pain: epidural  Consent for AROM. AROM'd for small amt clear fluid. Anticipate labor progress and SVD.  Julianne Handler, CNM 4:17 PM

## 2022-01-26 NOTE — Anesthesia Preprocedure Evaluation (Signed)
Anesthesia Evaluation  Patient identified by MRN, date of birth, ID band Patient awake    Reviewed: Allergy & Precautions, NPO status , Patient's Chart, lab work & pertinent test results  Airway Mallampati: II  TM Distance: >3 FB Neck ROM: Full    Dental no notable dental hx. (+) Teeth Intact, Dental Advisory Given   Pulmonary neg pulmonary ROS   Pulmonary exam normal breath sounds clear to auscultation       Cardiovascular Exercise Tolerance: Good Normal cardiovascular exam Rhythm:Regular Rate:Normal     Neuro/Psych   Anxiety     negative neurological ROS     GI/Hepatic Neg liver ROS,,,  Endo/Other  negative endocrine ROS    Renal/GU negative Renal ROS     Musculoskeletal   Abdominal   Peds  Hematology Lab Results      Component                Value               Date                        HGB                      10.7 (L)            01/26/2022                HCT                      32.5 (L)            01/26/2022                   PLT                      304                 01/26/2022              Anesthesia Other Findings All: Amoxicillin  Reproductive/Obstetrics (+) Pregnancy                              Anesthesia Physical Anesthesia Plan  ASA: 2  Anesthesia Plan: Epidural   Post-op Pain Management:    Induction:   PONV Risk Score and Plan: Treatment may vary due to age or medical condition  Airway Management Planned:   Additional Equipment: None  Intra-op Plan:   Post-operative Plan:   Informed Consent: I have reviewed the patients History and Physical, chart, labs and discussed the procedure including the risks, benefits and alternatives for the proposed anesthesia with the patient or authorized representative who has indicated his/her understanding and acceptance.     Dental advisory given  Plan Discussed with:   Anesthesia Plan Comments: (38.3 wk  Primagravida for LEA)         Anesthesia Quick Evaluation

## 2022-01-27 ENCOUNTER — Encounter (HOSPITAL_COMMUNITY): Payer: Self-pay | Admitting: Family Medicine

## 2022-01-27 DIAGNOSIS — Z3A38 38 weeks gestation of pregnancy: Secondary | ICD-10-CM

## 2022-01-27 DIAGNOSIS — O411230 Chorioamnionitis, third trimester, not applicable or unspecified: Secondary | ICD-10-CM

## 2022-01-27 LAB — RPR: RPR Ser Ql: NONREACTIVE

## 2022-01-27 MED ORDER — IBUPROFEN 600 MG PO TABS
600.0000 mg | ORAL_TABLET | Freq: Four times a day (QID) | ORAL | Status: DC
  Administered 2022-01-27 – 2022-01-29 (×8): 600 mg via ORAL
  Filled 2022-01-27 (×8): qty 1

## 2022-01-27 MED ORDER — CEFAZOLIN SODIUM-DEXTROSE 2-4 GM/100ML-% IV SOLN
2.0000 g | Freq: Three times a day (TID) | INTRAVENOUS | Status: DC
  Administered 2022-01-27 (×2): 2 g via INTRAVENOUS
  Filled 2022-01-27 (×4): qty 100

## 2022-01-27 MED ORDER — MEASLES, MUMPS & RUBELLA VAC IJ SOLR: 0.5000 mL | Freq: Once | INTRAMUSCULAR | Status: DC

## 2022-01-27 MED ORDER — ACETAMINOPHEN 500 MG PO TABS
1000.0000 mg | ORAL_TABLET | Freq: Once | ORAL | Status: AC
  Administered 2022-01-27: 1000 mg via ORAL
  Filled 2022-01-27: qty 2

## 2022-01-27 MED ORDER — DIPHENHYDRAMINE HCL 25 MG PO CAPS: 25.0000 mg | ORAL_CAPSULE | Freq: Four times a day (QID) | ORAL | Status: DC | PRN

## 2022-01-27 MED ORDER — GENTAMICIN SULFATE 40 MG/ML IJ SOLN
5.0000 mg/kg | INTRAVENOUS | Status: DC
  Administered 2022-01-27: 330 mg via INTRAVENOUS
  Filled 2022-01-27 (×2): qty 8.25

## 2022-01-27 MED ORDER — PRENATAL MULTIVITAMIN CH
1.0000 | ORAL_TABLET | Freq: Every day | ORAL | Status: DC
  Administered 2022-01-28 – 2022-01-29 (×2): 1 via ORAL
  Filled 2022-01-27 (×2): qty 1

## 2022-01-27 MED ORDER — SENNOSIDES-DOCUSATE SODIUM 8.6-50 MG PO TABS
2.0000 | ORAL_TABLET | ORAL | Status: DC
  Administered 2022-01-28 – 2022-01-29 (×2): 2 via ORAL
  Filled 2022-01-27 (×2): qty 2

## 2022-01-27 MED ORDER — SIMETHICONE 80 MG PO CHEW: 80.0000 mg | CHEWABLE_TABLET | ORAL | Status: DC | PRN

## 2022-01-27 MED ORDER — OXYTOCIN-SODIUM CHLORIDE 30-0.9 UT/500ML-% IV SOLN
1.0000 m[IU]/min | INTRAVENOUS | Status: DC
  Administered 2022-01-27: 1 m[IU]/min via INTRAVENOUS

## 2022-01-27 MED ORDER — ONDANSETRON HCL 4 MG/2ML IJ SOLN: 4.0000 mg | INTRAMUSCULAR | Status: DC | PRN

## 2022-01-27 MED ORDER — BENZOCAINE-MENTHOL 20-0.5 % EX AERO
1.0000 | INHALATION_SPRAY | CUTANEOUS | Status: DC | PRN
  Administered 2022-01-27: 1 via TOPICAL
  Filled 2022-01-27: qty 56

## 2022-01-27 MED ORDER — DIPHENHYDRAMINE HCL 50 MG/ML IJ SOLN
25.0000 mg | Freq: Once | INTRAMUSCULAR | Status: AC
  Administered 2022-01-27: 25 mg via INTRAVENOUS
  Filled 2022-01-27: qty 1

## 2022-01-27 MED ORDER — ONDANSETRON HCL 4 MG PO TABS: 4.0000 mg | ORAL_TABLET | ORAL | Status: DC | PRN

## 2022-01-27 MED ORDER — TETANUS-DIPHTH-ACELL PERTUSSIS 5-2.5-18.5 LF-MCG/0.5 IM SUSY: 0.5000 mL | PREFILLED_SYRINGE | Freq: Once | INTRAMUSCULAR | Status: DC

## 2022-01-27 MED ORDER — WITCH HAZEL-GLYCERIN EX PADS: 1.0000 | MEDICATED_PAD | CUTANEOUS | Status: DC | PRN

## 2022-01-27 MED ORDER — TRANEXAMIC ACID-NACL 1000-0.7 MG/100ML-% IV SOLN
1000.0000 mg | INTRAVENOUS | Status: AC
  Administered 2022-01-27: 1000 mg via INTRAVENOUS
  Filled 2022-01-27: qty 100

## 2022-01-27 MED ORDER — ACETAMINOPHEN 325 MG PO TABS
650.0000 mg | ORAL_TABLET | ORAL | Status: DC | PRN
  Administered 2022-01-27: 650 mg via ORAL
  Filled 2022-01-27: qty 2

## 2022-01-27 MED ORDER — DIBUCAINE (PERIANAL) 1 % EX OINT: 1.0000 | TOPICAL_OINTMENT | CUTANEOUS | Status: DC | PRN

## 2022-01-27 MED ORDER — COCONUT OIL OIL: 1.0000 | TOPICAL_OIL | Status: DC | PRN

## 2022-01-27 MED ORDER — LACTATED RINGERS AMNIOINFUSION: INTRAVENOUS | Status: DC

## 2022-01-27 NOTE — Progress Notes (Addendum)
Labor Progress Note [name] is a 37 y.o. G1P0000 at [redacted]w[redacted]d presented for labor  S:  Feeling uncomfortable   O:  BP 98/60   Pulse 88   Temp (!) 101.4 F (38.6 C) (Axillary)   Resp 20   Ht 5\' 5"  (1.651 m)   Wt 80.1 kg   LMP 05/02/2021   SpO2 100%   BMI 29.39 kg/m  EFM: baseline 160 bpm/ mod variability/ + accels/ occasional  decels Toco: q4-5 mins SVE: Dilation: Lip/rim Effacement (%): 90 Cervical Position: Posterior, Middle Station: 0 Presentation: Vertex Exam by:: Asencion Noble, Rn   A/P: 37 y.o. G1P0000 [redacted]w[redacted]d  1. Labor: active. Patient having late decelerations, Pitocin stopped, fluid bolus given, position changes and improvement in FHT. FHT tachycardic and patient now febrile. Will treat triple I with tylenol and  antibiotics. Plan to restart pitocin in 20-30 min  2. FWB: Cat 2, recovering with bolus, position changes, amnioinfusion. Will give recovery time and restart pitocin when able/  3. Pain: epidural   Anticipate SVD.  [name], MD 4:46 AM

## 2022-01-27 NOTE — Discharge Summary (Signed)
Postpartum Discharge Summary    Patient Name: Jenna Gonzalez DOB: Jun 11, 1985 MRN: [id number]  Date of admission: 01/26/2022 Delivery date:01/27/2022  Delivering provider: Julianne Handler  Date of discharge: 01/29/2022  Admitting diagnosis: Indication for care in labor or delivery [O75.9] Intrauterine pregnancy: [redacted]w[redacted]d    Secondary diagnosis:  Principal Problem:   Indication for care in labor or delivery Active Problems:   SVD (spontaneous vaginal delivery)  Additional problems: Triple I    Discharge diagnosis: Term Pregnancy Delivered                                              Post partum procedures: n/a Augmentation: AROM and Pitocin Complications: Intrauterine Inflammation or infection (Chorioamniotis)  Hospital course: Onset of Labor With Vaginal Delivery      37y.o. yo G1P0000 at 325w4das admitted in Active Labor on 01/26/2022. Labor course was complicated by prolonged second stage and Triple I.   Membrane Rupture Time/Date: 4:02 PM ,01/26/2022   Delivery Method:Vaginal, Spontaneous  Episiotomy: None  Lacerations:    Patient had a postpartum course complicated by none.  She is ambulating, tolerating a regular diet, passing flatus, and urinating well. Patient is discharged home in stable condition on 01/29/22.  Newborn Data: Birth date:01/27/2022  Birth time:1:42 PM  Gender:Female  Living status:Living  Apgars:5 ,5  Weight:3572 g   Magnesium Sulfate received: No BMZ received: No Rhophylac:N/A MMR:N/A T-DaP:Given prenatally Flu: Yes Transfusion:No  Physical exam  Vitals:   01/28/22 1606 01/28/22 2200 01/29/22 0545 01/29/22 0750  BP: 120/75 110/71 107/75 112/71  Pulse:  72 60 62  Resp: 16 18 18 16  $ Temp: 97.8 F (36.6 C) 98.1 F (36.7 C) 97.6 F (36.4 C) 98.1 F (36.7 C)  TempSrc: Oral Oral Oral Oral  SpO2:  99% 99% 100%  Weight:      Height:       General: alert, cooperative, and no distress Lochia: appropriate Uterine Fundus: firm Incision:  N/A DVT Evaluation: No evidence of DVT seen on physical exam. Labs: Lab Results  Component Value Date   WBC 14.0 (H) 01/26/2022   HGB 10.7 (L) 01/26/2022   HCT 32.5 (L) 01/26/2022   MCV 78.5 (L) 01/26/2022   PLT 304 01/26/2022      Latest Ref Rng & Units 10/05/2020    3:51 PM  CMP  Glucose 70 - 99 mg/dL 87   BUN 6 - 23 mg/dL 16   Creatinine 0.40 - 1.20 mg/dL 0.84   Sodium 135 - 145 mEq/L 137   Potassium 3.5 - 5.1 mEq/L 4.1   Chloride 96 - 112 mEq/L 101   CO2 19 - 32 mEq/L 27   Calcium 8.4 - 10.5 mg/dL 9.6   Total Protein 6.0 - 8.3 g/dL 6.6   Total Bilirubin 0.2 - 1.2 mg/dL 0.3   Alkaline Phos 39 - 117 U/L 64   AST 0 - 37 U/L 18   ALT 0 - 35 U/L 12    Edinburgh Score:    01/27/2022    5:48 PM  Edinburgh Postnatal Depression Scale Screening Tool  I have been able to laugh and see the funny side of things. 0  I have looked forward with enjoyment to things. 0  I have blamed myself unnecessarily when things went wrong. 2  I have been anxious or worried for no good reason. 2  I have felt scared or panicky for no good reason. 1  Things have been getting on top of me. 1  I have been so unhappy that I have had difficulty sleeping. 0  I have felt sad or miserable. 0  I have been so unhappy that I have been crying. 0  The thought of harming myself has occurred to me. 0  Edinburgh Postnatal Depression Scale Total 6     After visit meds:  Allergies as of 01/29/2022       Reactions   Amoxicillin Hives, Rash        Medication List     TAKE these medications    acetaminophen 325 MG tablet Commonly known as: Tylenol Take 2 tablets (650 mg total) by mouth every 4 (four) hours as needed (for pain scale < 4).   benzocaine-Menthol 20-0.5 % Aero Commonly known as: DERMOPLAST Apply 1 Application topically as needed for irritation (perineal discomfort).   ibuprofen 600 MG tablet Commonly known as: ADVIL Take 1 tablet (600 mg total) by mouth every 6 (six) hours.    Norgestimate-Ethinyl Estradiol Triphasic 0.18/0.215/0.25 MG-35 MCG tablet Commonly known as: Tri-Sprintec Take 1 tablet by mouth daily. Start taking on: February 27, 2022   PRENATAL VITAMINS PO Take by mouth.   senna-docusate 8.6-50 MG tablet Commonly known as: Senokot-S Take 2 tablets by mouth daily.   venlafaxine XR 75 MG 24 hr capsule Commonly known as: Effexor XR Take 1 capsule (75 mg total) by mouth daily with breakfast.         Discharge home in stable condition Infant Feeding: Bottle and Breast Infant Disposition:home with mother Discharge instruction: per After Visit Summary and Postpartum booklet. Activity: Advance as tolerated. Pelvic rest for 6 weeks.  Diet: routine diet Future Appointments: Future Appointments  Date Time Provider [place]  03/23/2022 10:55 AM Truett Mainland, DO [facility] None   Follow up Visit: Message sent to [facility] on 1/28  Please schedule this patient for a In person postpartum visit in 6 weeks with the following provider: Any provider. Additional Postpartum F/U:Postpartum Depression checkup  Low risk pregnancy complicated by:  none Delivery mode:  Vaginal, Spontaneous  Anticipated Birth Control:  OCPs   01/29/2022 Shelda Pal, DO

## 2022-01-27 NOTE — Progress Notes (Signed)
Labor Progress Note SHARMAN GARROTT is a 37 y.o. G1P0000 at [redacted]w[redacted]d presented for labor  S:  Much more comfortable at this time   O:  BP 104/60   Pulse 79   Temp 100 F (37.8 C)   Resp 20   Ht 5\' 5"  (1.651 m)   Wt 80.1 kg   LMP 05/02/2021   SpO2 100%   BMI 29.39 kg/m  EFM: baseline 150 bpm/ mod variability/ + accels/ occasional  variable decels Toco: q4-5 mins SVE: Dilation: 9 Effacement (%): 90, 80 Cervical Position: Posterior, Middle Station: 0 Presentation: Vertex Exam by:: Dr. Caron Presume   A/P: 37 y.o. G1P0000 [redacted]w[redacted]d  1. Labor: active. Recovering well after last check. Plan on starting 1x1 pitocin  2. FWB: Cat 2, Amnioinfusion running.  3. Pain: epidural   Anticipate SVD.  [name], MD 7:49 AM

## 2022-01-27 NOTE — Progress Notes (Signed)
Labor Progress Note Jenna Gonzalez is a 37 y.o. G1P0000 at [redacted]w[redacted]d presented for labor  S:  Starting to feel more discomfort with contractions  O:  BP 118/82   Pulse 95   Temp 98.1 F (36.7 C) (Oral)   Resp 20   Ht 5\' 5"  (1.651 m)   Wt 80.1 kg   LMP 05/02/2021   SpO2 100%   BMI 29.39 kg/m  EFM: baseline 120 bpm/ mod variability/ + accels/ occasional varriables decels Toco: q4-6 mins SVE: Dilation: 9 Effacement (%): 90 Cervical Position: Posterior, Middle Station: -1, 0 Presentation: Vertex Exam by:: Ronette Deter, RN   A/P: 37 y.o. G1P0000 [redacted]w[redacted]d  1. Labor: active. IUPC placed at last check and pt is now having adequate contractions. Pt is making change, but due to some variables will turn off pitocin.   2. FWB: Cat I 3. Pain: epidural   Anticipate SVD.  Abram Sander, MD 2:45 AM

## 2022-01-27 NOTE — Progress Notes (Signed)
Patient is a 37 yo g1, uncomplicated pregnancy, who presented in labor. Now complete and has pushed a total of about 3 hours. Has progressed to 2+ station. With recurrent late decels (baseline 150, decels to about 120, with good recovery). Reviewed labor course with patient at bedside. Given progress with pushing discussed expectant mgmt at this time. Discussed if worsening fetal status or if fails to continue to make progress would advise attempt at operative delivery and discussed risks/benefits of that including rare risk of intracranial hemorrhage. Discussed that cesarean is also an option if ECV declined or if ECV fails. Patient wishes to proceed with pushing, will monitor closely. She has an epidural.

## 2022-01-27 NOTE — Progress Notes (Signed)
Labor Progress Note ANAIZA BEHRENS is a 37 y.o. G1P0000 at [redacted]w[redacted]d presented for labor  S:  Comfortable with epidural.   O:  BP (!) 107/56   Pulse 88   Temp 98.4 F (36.9 C) (Oral)   Resp 16   Ht 5\' 5"  (1.651 m)   Wt 80.1 kg   LMP 05/02/2021   SpO2 100%   BMI 29.39 kg/m  EFM: baseline 155 bpm/ mod variability/ + accels/ occ variable decels  Toco/IUPC: 2-4 SVE:10/100/0 Pitocin: 1 mu/min  A/P: 37 y.o. G1P0000 [redacted]w[redacted]d  1. Labor: second stage 2. FWB: Cat II 3. Pain: epidural 4. Triple I- stable on abx  Active pushing ~45 min, recommend laboring down then resume pushing. Anticipate SVD.  Julianne Handler, CNM 9:27 AM

## 2022-01-28 MED ORDER — VENLAFAXINE HCL ER 75 MG PO CP24
75.0000 mg | ORAL_CAPSULE | Freq: Every day | ORAL | Status: DC
  Administered 2022-01-29: 75 mg via ORAL
  Filled 2022-01-28: qty 1

## 2022-01-28 NOTE — Progress Notes (Cosign Needed)
POSTPARTUM PROGRESS NOTE  Post Partum Day 1  Subjective:  Jenna Gonzalez is a 37 y.o. G1P1001 s/p NSVD at [redacted]w[redacted]d.  No acute events overnight.  Pt denies problems with ambulating, voiding or po intake.  She denies nausea or vomiting.  Pain is well controlled.  She has had flatus. She has not had bowel movement.  Lochia Moderate.   Objective: Blood pressure 115/73, pulse 63, temperature 97.7 F (36.5 C), temperature source Oral, resp. rate 16, height 5\' 5"  (1.651 m), weight 80.1 kg, last menstrual period 05/02/2021, SpO2 97 %, unknown if currently breastfeeding.  Physical Exam:  General: alert, cooperative and no distress Chest: no respiratory distress Heart:regular rate, distal pulses intact Abdomen: soft, nontender,  Uterine Fundus: firm, appropriately tender DVT Evaluation: No calf swelling or tenderness Extremities: No edema Skin: warm, dry  Recent Labs    01/26/22 1216  HGB 10.7*  HCT 32.5*    Assessment/Plan: Jenna Gonzalez is a 37 y.o. G1P1001 s/p NSVD at [redacted]w[redacted]d   PPD#1 - Doing well Contraception: POPs Feeding: breast Dispo: Plan for discharge tomorrow .   LOS: 2 days   Abram Sander, MD PGY1 01/28/2022, 7:58 AM    Fellow Attestation  I saw and evaluated the patient, performing the key elements of the service.I  personally performed or re-performed the history, physical exam, and medical decision making activities of this service and have verified that the service and findings are accurately documented in the resident's note. I developed the management plan that is described in the resident's note, and I agree with the content, with my edits above.    Gifford Shave, MD OB Fellow

## 2022-01-28 NOTE — Anesthesia Postprocedure Evaluation (Signed)
Anesthesia Post Note  Patient: Jenna Gonzalez  Procedure(s) Performed: AN AD [place]     Patient location during evaluation: Mother Baby Anesthesia Type: Epidural Level of consciousness: awake and alert Pain management: pain level controlled Vital Signs Assessment: post-procedure vital signs reviewed and stable Respiratory status: spontaneous breathing, nonlabored ventilation and respiratory function stable Cardiovascular status: stable Postop Assessment: no headache, no backache and epidural receding Anesthetic complications: no   No notable events documented.  Last Vitals:  Vitals:   01/28/22 0218 01/28/22 0509  BP: 133/70 115/73  Pulse: 64 63  Resp: 18 16  Temp: 36.5 C 36.5 C  SpO2: 100% 97%    Last Pain:  Vitals:   01/28/22 0509  TempSrc: Oral  PainSc:    Pain Goal:                   Rayvon Char

## 2022-01-28 NOTE — Lactation Note (Signed)
This note was copied from a baby's chart. Lactation Consultation Note  Patient Name: Jenna Gonzalez Date: 01/28/2022 Reason for consult: Initial assessment;Exclusive pumping and bottle feeding;Early term 37-38.6wks;Breastfeeding assistance Age:35 hours  LC entered the room and the birth parent was holding the infant.  The birth parent stated that she wanted to exclusively pump.  [place] set up the DEBP and showed the birth parent how to assemble, disassemble, and wash pump parts.  The birth parent was pumping with a size #21 flange and stated that it was comfortable.  LC reviewed information on milk storage guidelines and all questions were answered.   Infant Feeding Plan: Pump every 3 hours.  Feed the expressed breast milk to the infant via a bottle or syringe.  Call [place] for assistance with breastfeeding.   Maternal Data Has patient been taught Hand Expression?: Yes Does the patient have breastfeeding experience prior to this delivery?: No  Feeding Mother's Current Feeding Choice: Breast Milk and Formula  Lactation Tools Discussed/Used Tools: Pump;Flanges Flange Size: 21 Breast pump type: Double-Electric Breast Pump Pump Education: Setup, frequency, and cleaning;Milk Storage Reason for Pumping: Birth parent's request Pumping frequency: Every 3 hours  Interventions Interventions: Breast feeding basics reviewed;Education;LC Services brochure  Discharge Pump: Personal;DEBP  Consult Status Consult Status: Follow-up Date: 01/29/22 Follow-up type: In-patient   Lysbeth Penner 01/28/2022, 12:13 PM

## 2022-01-28 NOTE — Progress Notes (Signed)
MOB was referred for history of depression/anxiety. * Referral screened out by Clinical Social Worker because none of the following criteria appear to apply: ~ History of anxiety/depression during this pregnancy, or of post-partum depression following prior delivery. ~ Diagnosis of anxiety and/or depression within last 3 years OR * MOB's symptoms currently being treated with medication and/or therapy. MOB is prescribed venlafaxine XR 75mg daily.  Please contact the Clinical Social Worker if needs arise, by MOB request, or if MOB scores greater than 9/yes to question 10 on Edinburgh Postpartum Depression Screen.  Signed,  Chanetta Moosman K. Jahnae Mcadoo, MSW, LCSWA, LCASA 01/28/2022 8:32 AM   

## 2022-01-28 NOTE — Lactation Note (Signed)
This note was copied from a baby's chart. Lactation Consultation Note  Patient Name: Jenna Gonzalez [id number] Date: 01/28/2022   Age:35 hours LC attempted to see mom but she was sleeping. Maternal Data    Feeding Nipple Type: Slow - flow  LATCH Score                    Lactation Tools Discussed/Used    Interventions    Discharge    Consult Status      Jenna Gonzalez, Jenna Gonzalez 01/28/2022, 12:03 AM

## 2022-01-29 MED ORDER — ACETAMINOPHEN 325 MG PO TABS: 650.0000 mg | ORAL_TABLET | ORAL | 0 refills | Status: DC | PRN

## 2022-01-29 MED ORDER — SENNOSIDES-DOCUSATE SODIUM 8.6-50 MG PO TABS: 2.0000 | ORAL_TABLET | ORAL | 0 refills | Status: DC

## 2022-01-29 MED ORDER — NORGESTIM-ETH ESTRAD TRIPHASIC 0.18/0.215/0.25 MG-35 MCG PO TABS
1.0000 | ORAL_TABLET | Freq: Every day | ORAL | 11 refills | Status: DC
  Filled 2022-01-29: qty 84, 84d supply, fill #0
  Filled 2022-04-12: qty 84, 84d supply, fill #1
  Filled 2022-07-24: qty 84, 84d supply, fill #2
  Filled 2022-10-30: qty 84, 84d supply, fill #3

## 2022-01-29 MED ORDER — IBUPROFEN 600 MG PO TABS: 600.0000 mg | ORAL_TABLET | Freq: Four times a day (QID) | ORAL | 0 refills | Status: DC

## 2022-01-29 MED ORDER — BENZOCAINE-MENTHOL 20-0.5 % EX AERO: 1.0000 | INHALATION_SPRAY | CUTANEOUS | 0 refills | Status: DC | PRN

## 2022-01-29 NOTE — Lactation Note (Signed)
This note was copied from a baby's chart. Lactation Consultation Note  Patient Name: Jenna Gonzalez [id number] Date: 01/29/2022 Reason for consult: Primapara;1st time breastfeeding;Exclusive pumping and bottle feeding;Early term 37-38.6wks;Nipple pain/trauma Age:35 hours  P1, 38.1 weeks, exclusively pumping and feeding by bottle  Mother reports she has been pumping. Her nipples are red, tender and "itchy". She reports her nipples are being rubbed against the flange (21) and she is having some surrounding swelling of the areola tissue following pumping. Coconut oil and comfort gels given to promote nipple healing and lubricant with the pumping. Advised mother try both the 21 and 24 flange to see what is most comfortable. Mother reports she also has an [employer] Hands Free pump that has smaller size flanges that she can use. Instructed if nipple soreness worsens and/or "itchiness" continues to contact provider/ LC.  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  Basic breastfeeding/ pumping information regarding engorgement, milk storage and OP LC services and support.  Lactation Tools Discussed/Used Tools: Coconut oil;Comfort gels Pump Education: Setup, frequency, and cleaning;Milk Storage  Interventions Interventions: [place] Services brochure  Discharge Discharge Education: Engorgement and breast care;Warning signs for feeding baby Pump: DEBP;Employee Pump  Consult Status Consult Status: Complete Date: 01/29/22   Gwenevere Abbot 01/29/2022, 12:56 PM

## 2022-01-30 ENCOUNTER — Other Ambulatory Visit: Payer: Self-pay

## 2022-01-30 ENCOUNTER — Other Ambulatory Visit (HOSPITAL_BASED_OUTPATIENT_CLINIC_OR_DEPARTMENT_OTHER): Payer: Self-pay

## 2022-02-02 ENCOUNTER — Encounter: Payer: 59 | Admitting: Family Medicine

## 2022-02-04 ENCOUNTER — Telehealth (HOSPITAL_COMMUNITY): Payer: Self-pay | Admitting: *Deleted

## 2022-02-04 NOTE — Telephone Encounter (Signed)
Left phone voicemail message.  Odis Hollingshead, RN 02-04-2022 at 1:40pm

## 2022-02-07 ENCOUNTER — Inpatient Hospital Stay (HOSPITAL_COMMUNITY): Payer: 59

## 2022-02-07 ENCOUNTER — Inpatient Hospital Stay (HOSPITAL_COMMUNITY): Admission: RE | Admit: 2022-02-07 | Payer: 59 | Source: Home / Self Care | Admitting: Family Medicine

## 2022-03-17 ENCOUNTER — Other Ambulatory Visit (HOSPITAL_BASED_OUTPATIENT_CLINIC_OR_DEPARTMENT_OTHER): Payer: Self-pay

## 2022-03-23 ENCOUNTER — Ambulatory Visit (INDEPENDENT_AMBULATORY_CARE_PROVIDER_SITE_OTHER): Payer: 59 | Admitting: Family Medicine

## 2022-03-23 DIAGNOSIS — Z1339 Encounter for screening examination for other mental health and behavioral disorders: Secondary | ICD-10-CM

## 2022-03-23 DIAGNOSIS — F419 Anxiety disorder, unspecified: Secondary | ICD-10-CM

## 2022-03-23 NOTE — Progress Notes (Signed)
[place] Partum Visit Note  Jenna Gonzalez is a 37 y.o. G18P1001 female who presents for a postpartum visit. She is 7 weeks postpartum following a normal spontaneous vaginal delivery.  I have fully reviewed the prenatal and intrapartum course. The delivery was at 38.4 gestational weeks.  Anesthesia: epidural. Postpartum course has been uneventful. Baby is doing well. Baby is feeding by breast. Bleeding moderate lochia. Bowel function is normal. Bladder function is normal. Patient is not sexually active. Contraception method is OCP (estrogen/progesterone). Postpartum depression screening: negative.   The pregnancy intention screening data noted above was reviewed. Potential methods of contraception were discussed. The patient elected to proceed with No data recorded.   Edinburgh Postnatal Depression Scale - 03/23/22 1051       Edinburgh Postnatal Depression Scale:  In the Past 7 Days   I have been able to laugh and see the funny side of things. 0    I have looked forward with enjoyment to things. 0    I have blamed myself unnecessarily when things went wrong. 2    I have been anxious or worried for no good reason. 2    I have felt scared or panicky for no good reason. 1    Things have been getting on top of me. 1    I have been so unhappy that I have had difficulty sleeping. 0    I have felt sad or miserable. 0    I have been so unhappy that I have been crying. 0    The thought of harming myself has occurred to me. 0    Edinburgh Postnatal Depression Scale Total 6             Health Maintenance Due  Topic Date Due   COVID-19 Vaccine (4 - 2023-24 season) 09/02/2021    The following portions of the patient's history were reviewed and updated as appropriate: allergies, current medications, past family history, past medical history, past social history, past surgical history, and problem list.  Review of Systems Pertinent items are noted in HPI.  Objective:  BP 130/80   Pulse 68    Wt 152 lb (68.9 kg)   LMP 05/02/2021   Breastfeeding Yes   BMI 25.29 kg/m    General:  alert, cooperative, and no distress   Breasts:  not indicated  Lungs: clear to auscultation bilaterally  Heart:  regular rate and rhythm, S1, S2 normal, no murmur, click, rub or gallop  Abdomen: soft, non-tender; bowel sounds normal; no masses,  no organomegaly   Wound N/a  GU exam:  not indicated       Assessment:   1. Postpartum exam  2. Anxiety On effexor - doing well   Plan:   Essential components of care per ACOG recommendations:  1.  Mood and well being: Patient with negative depression screening today. Reviewed local resources for support.  - Patient tobacco use? No.   - hx of drug use? No.    2. Infant care and feeding:  -Patient currently breastmilk feeding? No.  -Social determinants of health (SDOH) reviewed in EPIC. No concerns  3. Sexuality, contraception and birth spacing - Patient does not want a pregnancy in the next year.  - Reviewed reproductive life planning. Reviewed contraceptive methods based on pt preferences and effectiveness.  Patient desired Oral Contraceptive today.   - Discussed birth spacing of 18 months  4. Sleep and fatigue -Encouraged family/partner/community support of 4 hrs of uninterrupted sleep to help  with mood and fatigue  5. Physical Recovery  - Discussed patients delivery and complications. She describes her labor as good. - Patient had a Vaginal, no problems at delivery. Patient had a 2nd degree laceration. Perineal healing reviewed. Patient expressed understanding - Patient has urinary incontinence? No. - Patient is safe to resume physical and sexual activity  6.  Health Maintenance - HM due items addressed Yes - Last pap smear  Diagnosis  Date Value Ref Range Status  12/23/2020   Final   - Negative for intraepithelial lesion or malignancy (NILM)   Pap smear not done at today's visit.  -Breast Cancer screening indicated? No.   7.  Chronic Disease/Pregnancy Condition follow up: None  - PCP follow up  [facility] [place]

## 2022-07-10 ENCOUNTER — Other Ambulatory Visit (HOSPITAL_COMMUNITY): Payer: Self-pay

## 2022-07-10 ENCOUNTER — Other Ambulatory Visit: Payer: Self-pay

## 2022-07-10 ENCOUNTER — Encounter: Payer: Self-pay | Admitting: Pharmacist

## 2022-08-02 ENCOUNTER — Encounter (INDEPENDENT_AMBULATORY_CARE_PROVIDER_SITE_OTHER): Payer: Self-pay

## 2022-08-18 ENCOUNTER — Ambulatory Visit (INDEPENDENT_AMBULATORY_CARE_PROVIDER_SITE_OTHER): Payer: 59 | Admitting: Family

## 2022-08-18 ENCOUNTER — Other Ambulatory Visit (HOSPITAL_BASED_OUTPATIENT_CLINIC_OR_DEPARTMENT_OTHER): Payer: Self-pay

## 2022-08-18 ENCOUNTER — Encounter: Payer: Self-pay | Admitting: Family

## 2022-08-18 VITALS — BP 130/82 | HR 76 | Temp 97.9°F | Resp 18 | Ht 65.0 in | Wt 149.0 lb

## 2022-08-18 DIAGNOSIS — Z0000 Encounter for general adult medical examination without abnormal findings: Secondary | ICD-10-CM | POA: Diagnosis not present

## 2022-08-18 MED ORDER — VENLAFAXINE HCL ER 150 MG PO CP24
150.0000 mg | ORAL_CAPSULE | Freq: Every day | ORAL | 3 refills | Status: DC
  Filled 2022-08-18: qty 90, 90d supply, fill #0

## 2022-08-18 NOTE — Progress Notes (Signed)
  Jenna Gonzalez is a 37 y.o. female with the following history as recorded in EpicCare:  Patient Active Problem List   Diagnosis Date Noted   Anxiety 12/29/2021    Current Outpatient Medications  Medication Sig Dispense Refill   Norgestimate-Ethinyl Estradiol Triphasic (TRI-SPRINTEC) 0.18/0.215/0.25 MG-35 MCG tablet Take 1 tablet by mouth daily. 28 tablet 11   venlafaxine XR (EFFEXOR XR) 150 MG 24 hr capsule Take 1 capsule (150 mg total) by mouth daily with breakfast. 90 capsule 3   No current facility-administered medications for this visit.    Allergies: Amoxicillin  Past Medical History:  Diagnosis Date   Anxiety    UTI (urinary tract infection)     Past Surgical History:  Procedure Laterality Date   APPENDECTOMY  01/01/2017    Family History  Problem Relation Age of Onset   Hypertension Mother    High Cholesterol Mother    Alcohol abuse Father     Social History   Tobacco Use   Smoking status: Never   Smokeless tobacco: Never  Substance Use Topics   Alcohol use: Not Currently    Subjective:   Presents for yearly CPE; has had her first child since last seen here- baby girl was born in January 2024; would like to increase Effexor XR dosage;   Review of Systems  Constitutional: Negative.   HENT: Negative.    Eyes: Negative.   Respiratory: Negative.    Cardiovascular: Negative.   Gastrointestinal: Negative.   Genitourinary: Negative.   Musculoskeletal: Negative.   Skin: Negative.   Neurological: Negative.   Endo/Heme/Allergies: Negative.   Psychiatric/Behavioral:  The patient is nervous/anxious.        Objective:  Vitals:   08/18/22 1406  BP: 130/82  Pulse: 76  Resp: 18  Temp: 97.9 F (36.6 C)  TempSrc: Oral  SpO2: 98%  Weight: 149 lb (67.6 kg)  Height: 5\' 5"  (1.651 m)    General: Well developed, well nourished, in no acute distress  Skin : Warm and dry.  Head: Normocephalic and atraumatic  Eyes: Sclera and conjunctiva clear; pupils round  and reactive to light; extraocular movements intact  Ears: External normal; canals clear; tympanic membranes normal  Oropharynx: Pink, supple. No suspicious lesions  Neck: Supple without thyromegaly, adenopathy  Lungs: Respirations unlabored; clear to auscultation bilaterally without wheeze, rales, rhonchi  CVS exam: normal rate and regular rhythm.  Abdomen: Soft; nontender; nondistended; normoactive bowel sounds; no masses or hepatosplenomegaly  Musculoskeletal: No deformities; no active joint inflammation  Extremities: No edema, cyanosis, clubbing  Vessels: Symmetric bilaterally  Neurologic: Alert and oriented; speech intact; face symmetrical; moves all extremities well; CNII-XII intact without focal deficit   Assessment:  1. PE (physical exam), annual     Plan:  Age appropriate preventive healthcare needs addressed; encouraged regular eye doctor and dental exams; encouraged regular exercise; will update labs and refills as needed today; follow-up in 1 year, sooner prn. Increase dosage of Effexor XR to 150 mg daily;    No follow-ups on file.  No orders of the defined types were placed in this encounter.   Requested Prescriptions   Signed Prescriptions Disp Refills   venlafaxine XR (EFFEXOR XR) 150 MG 24 hr capsule 90 capsule 3    Sig: Take 1 capsule (150 mg total) by mouth daily with breakfast.

## 2022-11-14 ENCOUNTER — Other Ambulatory Visit: Payer: Self-pay | Admitting: Family

## 2022-11-14 ENCOUNTER — Other Ambulatory Visit (HOSPITAL_BASED_OUTPATIENT_CLINIC_OR_DEPARTMENT_OTHER): Payer: Self-pay

## 2022-11-14 ENCOUNTER — Telehealth: Payer: Self-pay | Admitting: Family

## 2022-11-14 MED ORDER — VENLAFAXINE HCL ER 75 MG PO CP24
75.0000 mg | ORAL_CAPSULE | Freq: Every day | ORAL | 0 refills | Status: DC
  Filled 2022-11-14: qty 90, 90d supply, fill #0

## 2022-11-14 NOTE — Telephone Encounter (Signed)
Spoke with pt, pt is aware and expressed understanding. Pt is aware Rx has been sent in.

## 2022-11-14 NOTE — Telephone Encounter (Signed)
Pt called and stated that a couple months ago, her dose was increased for venlafaxine XR (EFFEXOR XR) 150 MG 24 hr capsule. Since the change, [place] stated that she feels light headed everyday and wanted to ask Vernona Rieger if she thinks she should decrease the dose or stop taking it for the moment. Please call and advise pt.

## 2023-01-01 ENCOUNTER — Other Ambulatory Visit (HOSPITAL_COMMUNITY): Payer: Self-pay

## 2023-01-01 MED ORDER — ONDANSETRON HCL 4 MG PO TABS
4.0000 mg | ORAL_TABLET | Freq: Three times a day (TID) | ORAL | 0 refills | Status: DC | PRN
  Filled 2023-01-01: qty 4, 2d supply, fill #0

## 2023-01-24 ENCOUNTER — Other Ambulatory Visit (HOSPITAL_BASED_OUTPATIENT_CLINIC_OR_DEPARTMENT_OTHER): Payer: Self-pay

## 2023-01-24 ENCOUNTER — Other Ambulatory Visit: Payer: Self-pay | Admitting: Family

## 2023-01-24 MED ORDER — VENLAFAXINE HCL ER 75 MG PO CP24
75.0000 mg | ORAL_CAPSULE | Freq: Every day | ORAL | 0 refills | Status: DC
  Filled 2023-01-24 – 2023-01-26 (×2): qty 90, 90d supply, fill #0

## 2023-01-26 ENCOUNTER — Other Ambulatory Visit: Payer: Self-pay

## 2023-01-26 ENCOUNTER — Telehealth: Payer: Self-pay

## 2023-01-26 ENCOUNTER — Other Ambulatory Visit (HOSPITAL_BASED_OUTPATIENT_CLINIC_OR_DEPARTMENT_OTHER): Payer: Self-pay

## 2023-01-26 MED ORDER — NORGESTIM-ETH ESTRAD TRIPHASIC 0.18/0.215/0.25 MG-35 MCG PO TABS
1.0000 | ORAL_TABLET | Freq: Every day | ORAL | 11 refills | Status: DC
  Filled 2023-01-26: qty 28, 28d supply, fill #0
  Filled 2023-02-18: qty 84, 84d supply, fill #1
  Filled 2023-04-15: qty 28, 28d supply, fill #1
  Filled 2023-05-06: qty 28, 28d supply, fill #2
  Filled 2023-05-07: qty 84, 84d supply, fill #2
  Filled 2023-05-07: qty 28, 28d supply, fill #2
  Filled 2023-08-06: qty 84, 84d supply, fill #3

## 2023-01-26 NOTE — Telephone Encounter (Signed)
Spoke with pt, advised pt her Rx has been sent in by another provider with 11 refills. Pt stated the on call nurse at OBGYN was able to send in Rx for pt.

## 2023-01-26 NOTE — Telephone Encounter (Signed)
Copied from CRM [id number]. Topic: Clinical - Medication Question >> Jan 26, 2023 12:39 PM Truddie Crumble wrote: Reason for CRM: pt called wanting to know if the provider can fill her birth control medication because her OBGYN is closed

## 2023-02-23 ENCOUNTER — Other Ambulatory Visit (HOSPITAL_BASED_OUTPATIENT_CLINIC_OR_DEPARTMENT_OTHER): Payer: Self-pay

## 2023-02-23 ENCOUNTER — Other Ambulatory Visit (HOSPITAL_COMMUNITY): Payer: Self-pay | Admitting: Family Medicine

## 2023-02-23 MED ORDER — CEFDINIR 300 MG PO CAPS: 300.0000 mg | ORAL_CAPSULE | Freq: Two times a day (BID) | ORAL | 0 refills | Status: DC

## 2023-02-23 MED ORDER — NORGESTIM-ETH ESTRAD TRIPHASIC 0.18/0.215/0.25 MG-35 MCG PO TABS
1.0000 | ORAL_TABLET | Freq: Every day | ORAL | 0 refills | Status: DC
  Filled 2023-02-23: qty 28, 28d supply, fill #0

## 2023-02-26 ENCOUNTER — Other Ambulatory Visit (HOSPITAL_COMMUNITY): Payer: Self-pay | Admitting: Family Medicine

## 2023-02-26 ENCOUNTER — Other Ambulatory Visit (HOSPITAL_COMMUNITY): Payer: Self-pay

## 2023-02-26 MED ORDER — CLINDAMYCIN HCL 300 MG PO CAPS
300.0000 mg | ORAL_CAPSULE | Freq: Three times a day (TID) | ORAL | 0 refills | Status: AC
  Filled 2023-02-26: qty 15, 5d supply, fill #0

## 2023-03-15 ENCOUNTER — Other Ambulatory Visit: Payer: Self-pay | Admitting: Family

## 2023-03-15 ENCOUNTER — Other Ambulatory Visit (HOSPITAL_BASED_OUTPATIENT_CLINIC_OR_DEPARTMENT_OTHER): Payer: Self-pay

## 2023-03-15 MED ORDER — NORGESTIM-ETH ESTRAD TRIPHASIC 0.18/0.215/0.25 MG-35 MCG PO TABS
1.0000 | ORAL_TABLET | Freq: Every day | ORAL | 0 refills | Status: DC
  Filled 2023-03-15 – 2023-03-18 (×2): qty 28, 28d supply, fill #0

## 2023-03-15 NOTE — Telephone Encounter (Signed)
 Copied from CRM [id number]. Topic: Clinical - Medication Refill >> Mar 15, 2023 12:53 PM Isabell A wrote: Most Recent Primary Care Visit:  Provider: Ria [facility]  Department: [facility]  Visit Type: PHYSICAL  Date: 08/18/2022  Medication: Norgestimate-Ethinyl Estradiol Triphasic (TRI-VYLIBRA) 0.18/0.215/0.25 MG-35 MCG tablet  Has the patient contacted their pharmacy? Yes (Agent: If no, request that the patient contact the pharmacy for the refill. If patient does not wish to contact the pharmacy document the reason why and proceed with request.) (Agent: If yes, when and what did the pharmacy advise?)  Is this the correct pharmacy for this prescription? Yes If no, delete pharmacy and type the correct one.  This is the patient's preferred pharmacy:  [facility] [place] [facility] [address] [place] [address] Phone: [phone] Fax: [phone]   Has the prescription been filled recently? Yes  Is the patient out of the medication? Yes  Has the patient been seen for an appointment in the last year OR does the patient have an upcoming appointment? Yes  Can we respond through MyChart? Yes  Agent: Please be advised that Rx refills may take up to 3 business days. We ask that you follow-up with your pharmacy.

## 2023-03-19 ENCOUNTER — Other Ambulatory Visit (HOSPITAL_BASED_OUTPATIENT_CLINIC_OR_DEPARTMENT_OTHER): Payer: Self-pay

## 2023-04-16 ENCOUNTER — Other Ambulatory Visit (HOSPITAL_BASED_OUTPATIENT_CLINIC_OR_DEPARTMENT_OTHER): Payer: Self-pay

## 2023-04-16 ENCOUNTER — Other Ambulatory Visit: Payer: Self-pay | Admitting: Family

## 2023-05-06 ENCOUNTER — Other Ambulatory Visit (HOSPITAL_BASED_OUTPATIENT_CLINIC_OR_DEPARTMENT_OTHER): Payer: Self-pay

## 2023-05-06 ENCOUNTER — Other Ambulatory Visit: Payer: Self-pay | Admitting: Family

## 2023-05-07 ENCOUNTER — Other Ambulatory Visit (HOSPITAL_BASED_OUTPATIENT_CLINIC_OR_DEPARTMENT_OTHER): Payer: Self-pay

## 2023-05-07 ENCOUNTER — Other Ambulatory Visit: Payer: Self-pay

## 2023-05-07 MED ORDER — VENLAFAXINE HCL ER 75 MG PO CP24
75.0000 mg | ORAL_CAPSULE | Freq: Every day | ORAL | 0 refills | Status: DC
  Filled 2023-05-07: qty 30, 30d supply, fill #0

## 2023-05-08 ENCOUNTER — Telehealth: Payer: Self-pay

## 2023-05-08 ENCOUNTER — Other Ambulatory Visit: Payer: Self-pay | Admitting: Family

## 2023-05-08 ENCOUNTER — Other Ambulatory Visit (HOSPITAL_BASED_OUTPATIENT_CLINIC_OR_DEPARTMENT_OTHER): Payer: Self-pay

## 2023-05-08 MED ORDER — VENLAFAXINE HCL ER 75 MG PO CP24
75.0000 mg | ORAL_CAPSULE | Freq: Every day | ORAL | 0 refills | Status: DC
  Filled 2023-05-08 – 2023-06-05 (×2): qty 90, 90d supply, fill #0

## 2023-05-08 NOTE — Telephone Encounter (Signed)
 Copied from CRM [id number]. Topic: Clinical - Medication Question >> May 08, 2023  2:32 PM Dewanda Foots wrote: Reason for CRM:  Iverson called in wanting to know why this medication requires an appt for this when she is not due to come back in until October and has been on this medication for over 10 years now. Please call her and let her know what she needs to do please. Showing 0 refills and only has a 30-day supply and believes it should have been 90-days which would not even get her to October either.   venlafaxine  XR (EFFEXOR  XR) 75 MG 24 hr capsule  [facility] [place] [facility] [address] [place] [address] Phone: [phone] Fax: [phone] Hours: M-F 7:30am-6pm

## 2023-05-09 NOTE — Telephone Encounter (Signed)
Rx has been sent in. 

## 2023-06-06 ENCOUNTER — Other Ambulatory Visit (HOSPITAL_BASED_OUTPATIENT_CLINIC_OR_DEPARTMENT_OTHER): Payer: Self-pay

## 2023-08-21 ENCOUNTER — Encounter: Payer: Self-pay | Admitting: Family

## 2023-08-21 ENCOUNTER — Ambulatory Visit (INDEPENDENT_AMBULATORY_CARE_PROVIDER_SITE_OTHER): Admitting: Family

## 2023-08-21 ENCOUNTER — Other Ambulatory Visit (HOSPITAL_BASED_OUTPATIENT_CLINIC_OR_DEPARTMENT_OTHER): Payer: Self-pay

## 2023-08-21 VITALS — BP 116/72 | HR 67 | Temp 97.5°F | Ht 65.0 in | Wt 151.2 lb

## 2023-08-21 DIAGNOSIS — L989 Disorder of the skin and subcutaneous tissue, unspecified: Secondary | ICD-10-CM

## 2023-08-21 DIAGNOSIS — Z13220 Encounter for screening for lipoid disorders: Secondary | ICD-10-CM

## 2023-08-21 DIAGNOSIS — Z0000 Encounter for general adult medical examination without abnormal findings: Secondary | ICD-10-CM | POA: Diagnosis not present

## 2023-08-21 LAB — CBC WITH DIFFERENTIAL/PLATELET
Basophils Absolute: 0.1 K/uL (ref 0.0–0.1)
Basophils Relative: 1.2 % (ref 0.0–3.0)
Eosinophils Absolute: 0.2 K/uL (ref 0.0–0.7)
Eosinophils Relative: 2.3 % (ref 0.0–5.0)
HCT: 38.7 % (ref 36.0–46.0)
Hemoglobin: 12.3 g/dL (ref 12.0–15.0)
Lymphocytes Relative: 30.3 % (ref 12.0–46.0)
Lymphs Abs: 2.2 K/uL (ref 0.7–4.0)
MCHC: 31.9 g/dL (ref 30.0–36.0)
MCV: 80.1 fl (ref 78.0–100.0)
Monocytes Absolute: 0.4 K/uL (ref 0.1–1.0)
Monocytes Relative: 5.7 % (ref 3.0–12.0)
Neutro Abs: 4.3 K/uL (ref 1.4–7.7)
Neutrophils Relative %: 60.5 % (ref 43.0–77.0)
Platelets: 288 K/uL (ref 150.0–400.0)
RBC: 4.82 Mil/uL (ref 3.87–5.11)
RDW: 16.8 % — ABNORMAL HIGH (ref 11.5–15.5)
WBC: 7.1 K/uL (ref 4.0–10.5)

## 2023-08-21 LAB — LIPID PANEL
Cholesterol: 203 mg/dL — ABNORMAL HIGH (ref 0–200)
HDL: 73.1 mg/dL (ref >39.00)
LDL Cholesterol: 96 mg/dL (ref 0–99)
NonHDL: 129.8
Total CHOL/HDL Ratio: 3
Triglycerides: 170 mg/dL — ABNORMAL HIGH (ref 0.0–149.0)
VLDL: 34 mg/dL (ref 0.0–40.0)

## 2023-08-21 LAB — COMPREHENSIVE METABOLIC PANEL WITH GFR
ALT: 12 U/L (ref 0–35)
AST: 19 U/L (ref 0–37)
Albumin: 4.2 g/dL (ref 3.5–5.2)
Alkaline Phosphatase: 60 U/L (ref 39–117)
BUN: 13 mg/dL (ref 6–23)
CO2: 30 meq/L (ref 19–32)
Calcium: 9.3 mg/dL (ref 8.4–10.5)
Chloride: 102 meq/L (ref 96–112)
Creatinine, Ser: 0.76 mg/dL (ref 0.40–1.20)
GFR: 101.1 mL/min (ref >60.00)
Glucose, Bld: 82 mg/dL (ref 70–99)
Potassium: 3.9 meq/L (ref 3.5–5.1)
Sodium: 138 meq/L (ref 135–145)
Total Bilirubin: 0.3 mg/dL (ref 0.2–1.2)
Total Protein: 6.6 g/dL (ref 6.0–8.3)

## 2023-08-21 MED ORDER — NORGESTIM-ETH ESTRAD TRIPHASIC 0.18/0.215/0.25 MG-35 MCG PO TABS
1.0000 | ORAL_TABLET | Freq: Every day | ORAL | 3 refills | Status: AC
  Filled 2023-08-21 – 2023-10-31 (×2): qty 84, 84d supply, fill #0
  Filled 2024-01-22: qty 84, 84d supply, fill #1

## 2023-08-21 MED ORDER — VENLAFAXINE HCL ER 75 MG PO CP24
75.0000 mg | ORAL_CAPSULE | Freq: Every day | ORAL | 3 refills | Status: AC
  Filled 2023-08-21 – 2023-08-22 (×2): qty 90, 90d supply, fill #0
  Filled 2023-11-30: qty 90, 90d supply, fill #1

## 2023-08-21 NOTE — Progress Notes (Signed)
 Jenna Gonzalez is a 37 y.o. female with the following history as recorded in EpicCare:  Patient Active Problem List   Diagnosis Date Noted   Anxiety 12/29/2021    Current Outpatient Medications  Medication Sig Dispense Refill   Norgestimate -Ethinyl Estradiol Triphasic (TRI-VYLIBRA ) 0.18/0.215/0.25 MG-35 MCG tablet Take 1 tablet by mouth daily. 84 tablet 3   venlafaxine  XR (EFFEXOR  XR) 75 MG 24 hr capsule Take 1 capsule (75 mg total) by mouth daily with breakfast. 90 capsule 3   No current facility-administered medications for this visit.    Allergies: Amoxicillin  Past Medical History:  Diagnosis Date   Anxiety    UTI (urinary tract infection)     Past Surgical History:  Procedure Laterality Date   APPENDECTOMY  01/01/2017    Family History  Problem Relation Age of Onset   Hypertension Mother    High Cholesterol Mother    Alcohol abuse Father     Social History   Tobacco Use   Smoking status: Never   Smokeless tobacco: Never  Substance Use Topics   Alcohol use: Not Currently    Subjective:   Patient presents for yearly CPE; continuing to see GYN regularly; exercising 3 days/ week; doing okay on Effexor  XR and does need refill;  Review of Systems  Constitutional: Negative.   HENT: Negative.    Eyes: Negative.   Cardiovascular: Negative.   Gastrointestinal: Negative.   Genitourinary: Negative.   Musculoskeletal: Negative.   Skin: Negative.   Neurological: Negative.   Endo/Heme/Allergies: Negative.   Psychiatric/Behavioral: Negative.        Objective:  Vitals:   08/21/23 1324  BP: 116/72  Pulse: 67  Temp: (!) 97.5 F (36.4 C)  TempSrc: Oral  SpO2: 98%  Weight: 151 lb 3.2 oz (68.6 kg)  Height: 5' 5 (1.651 m)    General: Well developed, well nourished, in no acute distress  Skin : Warm and dry.  Head: Normocephalic and atraumatic  Eyes: Sclera and conjunctiva clear; pupils round and reactive to light; extraocular movements intact  Ears: External  normal; canals clear; tympanic membranes normal  Oropharynx: Pink, supple. No suspicious lesions  Neck: Supple without thyromegaly, adenopathy  Lungs: Respirations unlabored; clear to auscultation bilaterally without wheeze, rales, rhonchi  CVS exam: normal rate and regular rhythm.  Abdomen: Soft; nontender; nondistended; normoactive bowel sounds; no masses or hepatosplenomegaly  Musculoskeletal: No deformities; no active joint inflammation  Extremities: No edema, cyanosis, clubbing  Vessels: Symmetric bilaterally  Neurologic: Alert and oriented; speech intact; face symmetrical; moves all extremities well; CNII-XII intact without focal deficit   Assessment:  1. PE (physical exam), annual   2. Lipid screening   3. Skin lesion     Plan:  Age appropriate preventive healthcare needs addressed; encouraged regular eye doctor and dental exams; encouraged regular exercise; will update labs and refills as needed today; follow-up in 1 year, sooner prn.    No follow-ups on file.  Orders Placed This Encounter  Procedures   CBC with Differential/Platelet   Comp Met (CMET)   Lipid panel   Ambulatory referral to Dermatology    Referral Priority:   Routine    Referral Type:   Consultation    Referral Reason:   Specialty Services Required    Requested Specialty:   Dermatology    Number of Visits Requested:   1    Requested Prescriptions   Signed Prescriptions Disp Refills   venlafaxine  XR (EFFEXOR  XR) 75 MG 24 hr capsule 90 capsule 3  Sig: Take 1 capsule (75 mg total) by mouth daily with breakfast.   Norgestimate -Ethinyl Estradiol Triphasic (TRI-VYLIBRA ) 0.18/0.215/0.25 MG-35 MCG tablet 84 tablet 3    Sig: Take 1 tablet by mouth daily.

## 2023-08-22 ENCOUNTER — Other Ambulatory Visit (HOSPITAL_BASED_OUTPATIENT_CLINIC_OR_DEPARTMENT_OTHER): Payer: Self-pay

## 2023-08-22 ENCOUNTER — Ambulatory Visit: Payer: Self-pay | Admitting: Family

## 2023-10-26 ENCOUNTER — Other Ambulatory Visit (HOSPITAL_COMMUNITY): Payer: Self-pay

## 2023-10-26 MED ORDER — FLUZONE 0.5 ML IM SUSY
0.5000 mL | PREFILLED_SYRINGE | Freq: Once | INTRAMUSCULAR | 0 refills | Status: AC
  Filled 2023-10-26: qty 0.5, 1d supply, fill #0

## 2023-10-29 ENCOUNTER — Other Ambulatory Visit (HOSPITAL_COMMUNITY): Payer: Self-pay

## 2023-10-29 ENCOUNTER — Ambulatory Visit: Payer: Self-pay

## 2023-10-29 NOTE — Telephone Encounter (Signed)
 FYI Only or Action Required?: FYI only for provider.  Patient was last seen in primary care on 08/21/2023 by Jason Leita Repine, FNP.  Called Nurse Triage reporting Blood In Stools.  Symptoms began yesterday.  Interventions attempted: Nothing.  Symptoms are: gradually improving.  Triage Disposition: See PCP When Office is Open (Within 3 Days)  Patient/caregiver understands and will follow disposition?: Yes     Copied from CRM #[id number]. Topic: Clinical - Red Word Triage >> Oct 29, 2023 10:43 AM Gustabo D wrote: Diarrhea with blood in it and a upset stomach started last night      Reason for Disposition  MILD rectal bleeding (e.g., more than just a few drops or streaks)  Answer Assessment - Initial Assessment Questions 1. APPEARANCE of BLOOD: What color is it? Is it passed separately, on the surface of the stool, or mixed in with the stool?      Streaks with some clots  2. AMOUNT: How much blood was passed?      Small amount  3. FREQUENCY: How many times has blood been passed with the stools?      Single episode  4. ONSET: When was the blood first seen in the stools? (Days or weeks)      Last night, 4 episodes of blood  5. DIARRHEA: Is there also some diarrhea? If Yes, ask: How many diarrhea stools in the past 24 hours?      Yes 6. CONSTIPATION: Do you have constipation? If Yes, ask: How bad is it?     No 7. RECURRENT SYMPTOMS: Have you had blood in your stools before? If Yes, ask: When was the last time? and What happened that time?      No 8. BLOOD THINNERS: Do you take any blood thinners? (e.g., aspirin, clopidogrel / Plavix, coumadin, heparin). Notes: Other strong blood thinners include: Arixtra (fondaparinux), Eliquis (apixaban), Pradaxa (dabigatran), and Xarelto (rivaroxaban).     No 9. OTHER SYMPTOMS: Do you have any other symptoms?  (e.g., abdomen pain, vomiting, dizziness, fever)     Abdominal cramping  10. PREGNANCY: Is there  any chance you are pregnant? When was your last menstrual period?       No  Protocols used: Rectal Bleeding-A-AH

## 2023-10-30 ENCOUNTER — Encounter: Payer: Self-pay | Admitting: Family Medicine

## 2023-10-30 ENCOUNTER — Ambulatory Visit: Admitting: Family Medicine

## 2023-10-30 VITALS — BP 121/88 | HR 100 | Ht 65.0 in | Wt 147.0 lb

## 2023-10-30 DIAGNOSIS — K921 Melena: Secondary | ICD-10-CM

## 2023-10-30 LAB — CBC WITH DIFFERENTIAL/PLATELET
Basophils Absolute: 0 K/uL (ref 0.0–0.1)
Basophils Relative: 0.7 % (ref 0.0–3.0)
Eosinophils Absolute: 0.1 K/uL (ref 0.0–0.7)
Eosinophils Relative: 1.6 % (ref 0.0–5.0)
HCT: 40.1 % (ref 36.0–46.0)
Hemoglobin: 12.9 g/dL (ref 12.0–15.0)
Lymphocytes Relative: 21.6 % (ref 12.0–46.0)
Lymphs Abs: 1.4 K/uL (ref 0.7–4.0)
MCHC: 32.3 g/dL (ref 30.0–36.0)
MCV: 82.4 fl (ref 78.0–100.0)
Monocytes Absolute: 0.3 K/uL (ref 0.1–1.0)
Monocytes Relative: 5.5 % (ref 3.0–12.0)
Neutro Abs: 4.5 K/uL (ref 1.4–7.7)
Neutrophils Relative %: 70.6 % (ref 43.0–77.0)
Platelets: 253 K/uL (ref 150.0–400.0)
RBC: 4.86 Mil/uL (ref 3.87–5.11)
RDW: 15.3 % (ref 11.5–15.5)
WBC: 6.3 K/uL (ref 4.0–10.5)

## 2023-10-30 LAB — COMPREHENSIVE METABOLIC PANEL WITH GFR
ALT: 16 U/L (ref 0–35)
AST: 19 U/L (ref 0–37)
Albumin: 4.1 g/dL (ref 3.5–5.2)
Alkaline Phosphatase: 59 U/L (ref 39–117)
BUN: 8 mg/dL (ref 6–23)
CO2: 28 meq/L (ref 19–32)
Calcium: 9.1 mg/dL (ref 8.4–10.5)
Chloride: 105 meq/L (ref 96–112)
Creatinine, Ser: 0.78 mg/dL (ref 0.40–1.20)
GFR: 97.86 mL/min (ref >60.00)
Glucose, Bld: 82 mg/dL (ref 70–99)
Potassium: 4.3 meq/L (ref 3.5–5.1)
Sodium: 139 meq/L (ref 135–145)
Total Bilirubin: 0.3 mg/dL (ref 0.2–1.2)
Total Protein: 6.3 g/dL (ref 6.0–8.3)

## 2023-10-30 NOTE — Progress Notes (Signed)
 Acute Office Visit  Subjective:  Patient ID: Jenna Gonzalez, female    DOB: 10-08-1985  Age: 38 y.o. MRN: [id number]  CC:  Chief Complaint  Patient presents with   Rectal Bleeding     HPI Jenna Gonzalez is here today for diarrhea with blood.     Discussed the use of AI scribe software for clinical note transcription with the patient, who gave verbal consent to proceed.  History of Present Illness Jenna Gonzalez is a 38 year old female who presents with diarrhea and rectal bleeding after running.  She experienced diarrhea after running on Sunday, which is a common occurrence for her. This episode was more severe, with approximately ten to fifteen episodes of diarrhea throughout the night. She noticed bright red blood clots and streaks in her stool, primarily clots, but the bleeding did not soak through tissue. She has not consumed any red foods recently.  Her usual bowel pattern is constipation except after running. Her last bowel movement was the previous evening, which was loose but without blood. She has a history of an anal fissure and underwent a colonoscopy in 2018, but has not noticed blood in her stool since then until this episode.  She also experienced chills alternating with feeling hot, which began yesterday. She felt nauseated and broke out in sweats when she attempted to go to work, but did not vomit. She has not been around anyone known to be sick, although a coworker was recently out with a gastrointestinal illness.  She ran eight miles on Sunday, which is longer than her usual distance, and continued despite feeling gastrointestinal discomfort, as she wanted to complete her run.          Past Medical History:  Diagnosis Date   Anxiety    UTI (urinary tract infection)     Past Surgical History:  Procedure Laterality Date   APPENDECTOMY  01/01/2017    Family History  Problem Relation Age of Onset   Hypertension Mother    High Cholesterol Mother     Alcohol abuse Father     Social History   Socioeconomic History   Marital status: Married    Spouse name: Velma   Number of children: Not on file   Years of education: Not on file   Highest education level: Master's degree (e.g., MA, MS, MEng, MEd, MSW, MBA)  Occupational History   Not on file  Tobacco Use   Smoking status: Never   Smokeless tobacco: Never  Vaping Use   Vaping status: Never Used  Substance and Sexual Activity   Alcohol use: Not Currently   Drug use: Never   Sexual activity: Yes  Other Topics Concern   Not on file  Social History Narrative   Not on file   Social Drivers of Health   Financial Resource Strain: Low Risk  (06/18/2019)   Received from [employer] and [facility]   Overall Financial Resource Strain (CARDIA)    Difficulty of Paying Living Expenses: Not hard at all  Food Insecurity: No Food Insecurity (06/18/2019)   Received from [employer] [facility]   Hunger Vital Sign    Within the past 12 months, you worried that your food would run out before you got the money to buy more.: Never true    Within the past 12 months, the food you bought just didn't last and you didn't have money to get more.: Never true  Transportation Needs: No Transportation Needs (06/18/2019)   Received from [facility]  and [employer] - Administrator, Civil Service (Medical): No    Lack of Transportation (Non-Medical): No  Physical Activity: Sufficiently Active (06/18/2019)   Received from [facility]   Exercise Vital Sign    On average, how many days per week do you engage in moderate to strenuous exercise (like a brisk walk)?: 5 days    On average, how many minutes do you engage in exercise at this level?: 50 min  Stress: No Stress Concern Present (06/18/2019)   Received from [employer] and [employer] of Occupational [facility] - Occupational Stress Questionnaire    Feeling of Stress : Only a little  Social Connections:  Socially Integrated (06/18/2019)   Received from [facility] and Isolation Panel    In a typical week, how many times do you talk on the phone with family, friends, or neighbors?: Twice a week    How often do you get together with friends or relatives?: Three times a week    How often do you attend church or religious services?: 1 to 4 times per year    Do you belong to any clubs or organizations such as church groups, unions, fraternal or athletic groups, or school groups?: Yes    How often do you attend meetings of the clubs or organizations you belong to?: More than 4 times per year    Are you married, widowed, divorced, separated, never married, or living with a partner?: Living with partner  Intimate Partner Violence: Not on file    ROS All ROS negative except what is listed in the HPI.  Objective:   Today's Vitals: BP 121/88   Pulse 100   Ht 5' 5 (1.651 m)   Wt 147 lb (66.7 kg)   SpO2 98%   BMI 24.46 kg/m   Physical Exam Vitals reviewed.  Constitutional:      Appearance: Normal appearance.  Abdominal:     Tenderness: There is no guarding.  Genitourinary:    Rectum: Guaiac result positive. No mass or anal fissure.     Comments: Possible small internal hemorrhoid  Neurological:     Mental Status: She is alert and oriented to person, place, and time.  Psychiatric:        Mood and Affect: Mood normal.        Behavior: Behavior normal.        Thought Content: Thought content normal.        Judgment: Judgment normal.      Assessment & Plan:   Problem List Items Addressed This Visit   None Visit Diagnoses       Bloody stool    -  Primary   Relevant Orders   CBC with Differential/Platelet   Comprehensive metabolic panel with GFR   Ambulatory referral to Gastroenterology       Assessment & Plan Acute diarrhea with hematochezia after running Acute diarrhea with hematochezia post-run. Differential includes viral  gastroenteritis, gut ischemia, and less likely diverticulitis. Previous colonoscopy showed fissure, no diverticula. Overt bleeding ceased. - Perform occult stool test for blood. Faintly positive.  - Order CBC for white blood cell count to ensure no infection, but not ill appearing on exam today.  - Refer to GI specialist for further evaluation. - Advise to pause running and increase fluids, gentle diet while gut settles. - Consider antibiotic if CBC shows elevated white blood cell count.       Follow-up:  Return if symptoms worsen or fail to improve.   Waddell FURY Almarie, DNP, FNP-C  I,Emily Lagle,acting as a neurosurgeon for Waddell KATHEE Almarie, NP.,have documented all relevant documentation on the behalf of Waddell KATHEE Almarie, NP,as directed by  Waddell KATHEE Almarie, NP while in the presence of Waddell KATHEE Almarie, NP.   I, Waddell KATHEE Almarie, NP, have reviewed all documentation for this visit. The documentation on 10/30/2023 for the exam, diagnosis, procedures, and orders are all accurate and complete.

## 2023-10-31 ENCOUNTER — Ambulatory Visit: Payer: Self-pay | Admitting: Family Medicine

## 2023-10-31 ENCOUNTER — Other Ambulatory Visit (HOSPITAL_BASED_OUTPATIENT_CLINIC_OR_DEPARTMENT_OTHER): Payer: Self-pay

## 2023-11-08 ENCOUNTER — Encounter: Payer: Self-pay | Admitting: Gastroenterology

## 2023-11-13 ENCOUNTER — Ambulatory Visit: Admitting: Physician Assistant

## 2023-11-14 ENCOUNTER — Encounter: Payer: Self-pay | Admitting: Gastroenterology

## 2023-11-14 ENCOUNTER — Other Ambulatory Visit (HOSPITAL_BASED_OUTPATIENT_CLINIC_OR_DEPARTMENT_OTHER): Payer: Self-pay

## 2023-11-14 ENCOUNTER — Ambulatory Visit (INDEPENDENT_AMBULATORY_CARE_PROVIDER_SITE_OTHER): Admitting: Gastroenterology

## 2023-11-14 VITALS — BP 122/72 | HR 70 | Ht 65.0 in | Wt 149.4 lb

## 2023-11-14 DIAGNOSIS — K625 Hemorrhage of anus and rectum: Secondary | ICD-10-CM

## 2023-11-14 DIAGNOSIS — F419 Anxiety disorder, unspecified: Secondary | ICD-10-CM

## 2023-11-14 DIAGNOSIS — K5909 Other constipation: Secondary | ICD-10-CM

## 2023-11-14 DIAGNOSIS — R1030 Lower abdominal pain, unspecified: Secondary | ICD-10-CM

## 2023-11-14 MED ORDER — NA SULFATE-K SULFATE-MG SULF 17.5-3.13-1.6 GM/177ML PO SOLN
1.0000 | Freq: Once | ORAL | 0 refills | Status: AC
  Filled 2023-11-14: qty 354, 1d supply, fill #0

## 2023-11-14 NOTE — Progress Notes (Signed)
 Chief Complaint: Rectal bleeding/diarrhea Primary GI MD: Sampson  HPI: Discussed the use of AI scribe software for clinical note transcription with the patient, who gave verbal consent to proceed.  Jenna Gonzalez is a 38 year old female who presents with rectal bleeding and diarrhea after running. She was referred by her primary doctor for evaluation of rectal bleeding and diarrhea.  She experienced profuse diarrhea and rectal bleeding following a run a few weeks ago. Cramping began two miles into her run, which she continued despite the discomfort. Upon returning home, she had severe diarrhea approximately ten times, with blood clots and small chunks of blood noted after the fifth or sixth episode. She has never experienced this type of bleeding before, even with a previous anal fissure.  The night following the episode, she experienced significant cramping and a lack of appetite, but returned to her normal state 48 hours later. Since then, she has resumed running without recurrence of diarrhea or bleeding.  Her bowel habits typically lean towards constipation, with three to four bowel movements per week, sometimes requiring straining. She denies any current medication use for constipation.  During the episode, she noted nausea but no vomiting, and no unintentional weight loss beyond the day of the incident. She also reported feeling 'super raw' but did not experience rectal pain during the bleeding episode.  Her past medical history includes a colonoscopy in 2018, which was normal except for an anal fissure. A recent fecal occult blood test was slightly positive two days after the diarrhea episode.  Family history is notable for a grandparent with colon cancer in their late seventies, but no history of inflammatory bowel diseases such as ulcerative colitis or Crohn's disease.   PREVIOUS GI WORKUP   Colonoscopy 11/2016 for rectal bleeding done outside GI showed anal fissure, otherwise  normal  Past Medical History:  Diagnosis Date   Anxiety    UTI (urinary tract infection)     Past Surgical History:  Procedure Laterality Date   APPENDECTOMY  01/01/2017    Current Outpatient Medications  Medication Sig Dispense Refill   Na Sulfate-K Sulfate-Mg Sulfate concentrate (SUPREP) 17.5-3.13-1.6 GM/177ML SOLN Take 1 kit (354 mLs total) by mouth once for 1 dose. 354 mL 0   Norgestimate -Ethinyl Estradiol Triphasic (TRI-VYLIBRA ) 0.18/0.215/0.25 MG-35 MCG tablet Take 1 tablet by mouth daily. 84 tablet 3   venlafaxine  XR (EFFEXOR  XR) 75 MG 24 hr capsule Take 1 capsule (75 mg total) by mouth daily with breakfast. 90 capsule 3   No current facility-administered medications for this visit.    Allergies as of 11/14/2023 - Review Complete 11/14/2023  Allergen Reaction Noted   Amoxicillin Hives and Rash 08/11/2011    Family History  Problem Relation Age of Onset   Hypertension Mother    High Cholesterol Mother    Alcohol abuse Father     Social History   Socioeconomic History   Marital status: Married    Spouse name: Velma   Number of children: Not on file   Years of education: Not on file   Highest education level: Master's degree (e.g., MA, MS, MEng, MEd, MSW, MBA)  Occupational History   Not on file  Tobacco Use   Smoking status: Never   Smokeless tobacco: Never  Vaping Use   Vaping status: Never Used  Substance and Sexual Activity   Alcohol use: Not Currently   Drug use: Never   Sexual activity: Yes  Other Topics Concern   Not on file  Social  History Narrative   Not on file   Social Drivers of Health   Financial Resource Strain: Low Risk (06/18/2019)   Received from [employer] and Affiliates   Overall [employer] (CARDIA)    Difficulty of Paying Living Expenses: Not hard at all  Food Insecurity: No Food Insecurity (06/18/2019)   Received from [employer] and [facility]ili[facility]   Hunger Vital Sign    Within the past 12 months, you worried that your  food would run out before you got the money to buy more.: Never true    Within the past 12 months, the food you bought just didn't last and you didn't have money to get more.: Never true  Transportation Needs: No Transportation Needs (06/18/2019)   Received from [employer] and [employer] - Administrator, Civil Service (Medical): No    Lack of Transportation (Non-Medical): No  Physical Activity: Sufficiently Active (06/18/2019)   Received from [facility] and Affiliates   Exercise Vital Sign    On average, how many days per week do you engage in moderate to strenuous exercise (like a brisk walk)?: 5 days    On average, how many minutes do you engage in exercise at this level?: 50 min  Stress: No Stress Concern Present (06/18/2019)   Received from [employer] and [employer] of Occupational Health - Occupational Stress Questionnaire    Feeling of Stress : Only a little  Social Connections: Socially Integrated (06/18/2019)   Received from [employer] and [employer] and Isolation Panel    In a typical week, how many times do you talk on the phone with family, friends, or neighbors?: Twice a week    How often do you get together with friends or relatives?: Three times a week    How often do you attend church or religious services?: 1 to 4 times per year    Do you belong to any clubs or organizations such as church groups, unions, fraternal or athletic groups, or school groups?: Yes    How often do you attend meetings of the clubs or organizations you belong to?: More than 4 times per year    Are you married, widowed, divorced, separated, never married, or living with a partner?: Living with partner  Intimate Partner Violence: Not on file    Review of Systems:    Constitutional: No weight loss, fever, chills, weakness or fatigue HEENT: Eyes: No change in vision               Ears, Nose, Throat:  No change in hearing or congestion Skin: No  rash or itching Cardiovascular: No chest pain, chest pressure or palpitations   Respiratory: No SOB or cough Gastrointestinal: See HPI and otherwise negative Genitourinary: No dysuria or change in urinary frequency Neurological: No headache, dizziness or syncope Musculoskeletal: No new muscle or joint pain Hematologic: No bleeding or bruising Psychiatric: No history of depression or anxiety    Physical Exam:  Vital signs: BP 122/72   Pulse 70   Ht 5' 5 (1.651 m)   Wt 149 lb 6 oz (67.8 kg)   LMP 10/29/2023   SpO2 99%   BMI 24.86 kg/m   Constitutional: NAD, alert and cooperative Head:  Normocephalic and atraumatic. Eyes:   PEERL, EOMI. No icterus. Conjunctiva pink. Respiratory: Respirations even and unlabored. Lungs clear to auscultation bilaterally.   No wheezes, crackles, or rhonchi.  Cardiovascular:  Regular rate and rhythm. No peripheral edema, cyanosis  or pallor.  Gastrointestinal:  Soft, nondistended, nontender. No rebound or guarding. Normal bowel sounds. No appreciable masses or hepatomegaly. Rectal:  Declines Msk:  Symmetrical without gross deformities. Without edema, no deformity or joint abnormality.  Neurologic:  Alert and  oriented x4;  grossly normal neurologically.  Skin:   Dry and intact without significant lesions or rashes. Psychiatric: Oriented to person, place and time. Demonstrates good judgement and reason without abnormal affect or behaviors.   RELEVANT LABS AND IMAGING: CBC    Component Value Date/Time   WBC 6.3 10/30/2023 0837   RBC 4.86 10/30/2023 0837   HGB 12.9 10/30/2023 0837   HGB 11.4 11/08/2021 0814   HCT 40.1 10/30/2023 0837   HCT 34.5 11/08/2021 0814   PLT 253.0 10/30/2023 0837   PLT 277 11/08/2021 0814   MCV 82.4 10/30/2023 0837   MCV 89 11/08/2021 0814   MCH 25.8 (L) 01/26/2022 1216   MCHC 32.3 10/30/2023 0837   RDW 15.3 10/30/2023 0837   RDW 12.1 11/08/2021 0814   LYMPHSABS 1.4 10/30/2023 0837   LYMPHSABS 1.5 07/20/2021 0840    MONOABS 0.3 10/30/2023 0837   EOSABS 0.1 10/30/2023 0837   EOSABS 0.1 07/20/2021 0840   BASOSABS 0.0 10/30/2023 0837   BASOSABS 0.1 07/20/2021 0840    CMP     Component Value Date/Time   NA 139 10/30/2023 0837   K 4.3 10/30/2023 0837   CL 105 10/30/2023 0837   CO2 28 10/30/2023 0837   GLUCOSE 82 10/30/2023 0837   BUN 8 10/30/2023 0837   CREATININE 0.78 10/30/2023 0837   CALCIUM 9.1 10/30/2023 0837   PROT 6.3 10/30/2023 0837   ALBUMIN 4.1 10/30/2023 0837   AST 19 10/30/2023 0837   ALT 16 10/30/2023 0837   ALKPHOS 59 10/30/2023 0837   BILITOT 0.3 10/30/2023 0837     Assessment/Plan:   Rectal bleeding Chronic constipation Episode of diarrhea Longstanding chronic constipation with recent episode of diarrhea after running in which she had rectal bleeding with clots after the 6 diarrheal bowel movement now back at baseline constipation and not on a bowel regimen.  Colonoscopy in 2018 with anal fissure, otherwise unrevealing.  CBC/CMP unrevealing.  Suspect bleeding is hemorrhoidal in nature which were exacerbated after multiple bowel movements.  It has since improved.  Patient is nervous and would like a colonoscopy, she states the presence of rectal bleeding gives her severe anxiety.  She had a reassuring colonoscopy in 2018.  No family history of colon cancer.  Suspect her chronic constipation with straining, history of pregnancy, frequent running likely indicates internal hemorrhoids that may be leading to her bleeding -Schedule colonoscopy - Recommend fiber 1 to 2 tablespoons/day - If no improvement on fiber recommend MiraLAX 1 capful per day - Squatty potty to help with bowel movements - Recommend avoiding straining to avoid worsening hemorrhoids - If internal hemorrhoids found on colonoscopy are amenable for banding, we can get this set up for her afterwards - 2-day prep  Anxiety On Effexor   Assigned  to Dr. Stacia based on procedure availability  Nestor Blower,  PA-C [facility] 11/14/2023, 10:11 AM  Cc: Almarie Waddell NOVAK, NP

## 2023-11-14 NOTE — Patient Instructions (Addendum)
 Start taking Miralax 1 capful (17 grams) 1x / day for 1 week.   If this is not effective, increase to 1 dose 2x / day for 1 week.   If this is still not effective, increase to two capfuls (34 grams) 2x / day.   Can adjust dose as needed based on response. Can take 1/2 cap daily, skip days, or increase per day.    You have been scheduled for a colonoscopy. Please follow written instructions given to you at your visit today.   If you use inhalers (even only as needed), please bring them with you on the day of your procedure.  DO NOT TAKE 7 DAYS PRIOR TO TEST- Trulicity (dulaglutide) Ozempic, Wegovy (semaglutide) Mounjaro, Zepbound (tirzepatide) Bydureon Bcise (exanatide extended release)  DO NOT TAKE 1 DAY PRIOR TO YOUR TEST Rybelsus (semaglutide) Adlyxin (lixisenatide) Victoza (liraglutide) Byetta (exanatide) ___________________________________________________________________________   If your blood pressure at your visit was 140/90 or greater, please contact your primary care physician to follow up on this.  _______________________________________________________  If you are age 36 or older, your body mass index should be between 23-30. Your Body mass index is 24.86 kg/m. If this is out of the aforementioned range listed, please consider follow up with your Primary Care Provider.  If you are age 17 or younger, your body mass index should be between 19-25. Your Body mass index is 24.86 kg/m. If this is out of the aformentioned range listed, please consider follow up with your Primary Care Provider.   ________________________________________________________  The [facility] providers would like to encourage you to use MYCHART to communicate with providers for non-urgent requests or questions.  Due to long hold times on the telephone, sending your provider a message by [facility] may be a faster and more efficient way to get a response.  Please allow 48 business hours for a response.   Please remember that this is for non-urgent requests.  _______________________________________________________  [facility] is using a team-based approach to care.  Your team is made up of your doctor and two to three APPS. Our APPS (Nurse Practitioners and Physician Assistants) work with your physician to ensure care continuity for you. They are fully qualified to address your health concerns and develop a treatment plan. They communicate directly with your gastroenterologist to care for you. Seeing the Advanced Practice Practitioners on your physician's team can help you by facilitating care more promptly, often allowing for earlier appointments, access to diagnostic testing, procedures, and other specialty referrals.

## 2023-11-16 ENCOUNTER — Encounter: Payer: Self-pay | Admitting: Gastroenterology

## 2023-11-19 ENCOUNTER — Other Ambulatory Visit (HOSPITAL_BASED_OUTPATIENT_CLINIC_OR_DEPARTMENT_OTHER): Payer: Self-pay

## 2023-11-23 ENCOUNTER — Ambulatory Visit: Admitting: Gastroenterology

## 2023-11-23 ENCOUNTER — Encounter: Payer: Self-pay | Admitting: Gastroenterology

## 2023-11-23 VITALS — BP 102/72 | HR 68 | Temp 97.2°F | Resp 12 | Ht 65.0 in | Wt 149.0 lb

## 2023-11-23 DIAGNOSIS — K5730 Diverticulosis of large intestine without perforation or abscess without bleeding: Secondary | ICD-10-CM | POA: Diagnosis not present

## 2023-11-23 DIAGNOSIS — K921 Melena: Secondary | ICD-10-CM

## 2023-11-23 DIAGNOSIS — Q439 Congenital malformation of intestine, unspecified: Secondary | ICD-10-CM | POA: Diagnosis not present

## 2023-11-23 DIAGNOSIS — K625 Hemorrhage of anus and rectum: Secondary | ICD-10-CM | POA: Diagnosis not present

## 2023-11-23 MED ORDER — SODIUM CHLORIDE 0.9 % IV SOLN: 500.0000 mL | Freq: Once | INTRAVENOUS | Status: AC

## 2023-11-23 NOTE — Patient Instructions (Addendum)
 YOU HAD AN ENDOSCOPIC PROCEDURE TODAY AT THE  ENDOSCOPY CENTER:   Refer to the procedure report that was given to you for any specific questions about what was found during the examination.  If the procedure report does not answer your questions, please call your gastroenterologist to clarify.  If you requested that your care partner not be given the details of your procedure findings, then the procedure report has been included in a sealed envelope for you to review at your convenience later.  YOU SHOULD EXPECT: Some feelings of bloating in the abdomen. Passage of more gas than usual.  Walking can help get rid of the air that was put into your GI tract during the procedure and reduce the bloating. If you had a lower endoscopy (such as a colonoscopy or flexible sigmoidoscopy) you may notice spotting of blood in your stool or on the toilet paper. If you underwent a bowel prep for your procedure, you may not have a normal bowel movement for a few days.  Please Note:  You might notice some irritation and congestion in your nose or some drainage.  This is from the oxygen used during your procedure.  There is no need for concern and it should clear up in a day or so.  SYMPTOMS TO REPORT IMMEDIATELY:  Following lower endoscopy (colonoscopy or flexible sigmoidoscopy):  Excessive amounts of blood in the stool  Significant tenderness or worsening of abdominal pains  Swelling of the abdomen that is new, acute  Fever of 100F or higher   For urgent or emergent issues, a gastroenterologist can be reached at any hour by calling [phone]. Do not use MyChart messaging for urgent concerns.    DIET:  We do recommend a small meal at first, but then you may proceed to your regular diet.  Drink plenty of fluids but you should avoid alcoholic beverages for 24 hours.  MEDICATIONS: Continue present medications.  FOLLOW UP: Repeat colonoscopy at age 46 for screening purposes. Recommend a 2-day prep with  next colonoscopy. Ensure aggressive hydration and carbohydrate loading prior to running in the future.  Thank you for allowing us  to provide for your healthcare needs today.  ACTIVITY:  You should plan to take it easy for the rest of today and you should NOT DRIVE or use heavy machinery until tomorrow (because of the sedation medicines used during the test).    FOLLOW UP: Our staff will call the number listed on your records the next business day following your procedure.  We will call around 7:15- 8:00 am to check on you and address any questions or concerns that you may have regarding the information given to you following your procedure. If we do not reach you, we will leave a message.     If any biopsies were taken you will be contacted by phone or by letter within the next 1-3 weeks.  Please call us  at [phone] if you have not heard about the biopsies in 3 weeks.    SIGNATURES/CONFIDENTIALITY: You and/or your care partner have signed paperwork which will be entered into your electronic medical record.  These signatures attest to the fact that that the information above on your After Visit Summary has been reviewed and is understood.  Full responsibility of the confidentiality of this discharge information lies with you and/or your care-partner.

## 2023-11-23 NOTE — Op Note (Signed)
 [facility] Patient Name: Jenna Gonzalez Procedure Date: 11/23/2023 8:30 AM MRN: [id number] Endoscopist: Glendia E. Stacia , MD, [id number] Age: 38 Referring MD:  Date of Birth: [birth year] Gender: Female Account #: [id number] Procedure:                Colonoscopy Indications:              Hematochezia Medicines:                Monitored Anesthesia Care Procedure:                Pre-Anesthesia Assessment:                           - Prior to the procedure, a History and Physical                            was performed, and patient medications and                            allergies were reviewed. The patient's tolerance of                            previous anesthesia was also reviewed. The risks                            and benefits of the procedure and the sedation                            options and risks were discussed with the patient.                            All questions were answered, and informed consent                            was obtained. Prior Anticoagulants: The patient has                            taken no anticoagulant or antiplatelet agents. ASA                            Grade Assessment: I - A normal, healthy patient.                            After reviewing the risks and benefits, the patient                            was deemed in satisfactory condition to undergo the                            procedure.                           After obtaining informed consent, the colonoscope  was passed under direct vision. Throughout the                            procedure, the patient's blood pressure, pulse, and                            oxygen saturations were monitored continuously. The                            CF [id number] #[id number] was introduced through the anus                            and advanced to the the terminal ileum, with                            identification of the appendiceal orifice and IC                             valve. The colonoscopy was somewhat difficult due                            to a redundant colon and a tortuous colon.                            Successful completion of the procedure was aided by                            using manual pressure. The patient tolerated the                            procedure well. The quality of the bowel                            preparation was fair. The terminal ileum, ileocecal                            valve, appendiceal orifice, and rectum were                            photographed. The bowel preparation used was SUPREP                            via split dose instruction. Scope In: 9:17:46 AM Scope Out: 9:40:19 AM Scope Withdrawal Time: 0 hours 15 minutes 19 seconds  Total Procedure Duration: 0 hours 22 minutes 33 seconds  Findings:                 The perianal and digital rectal examinations were                            normal. Pertinent negatives include normal                            sphincter tone and no palpable rectal  lesions.                           A single medium-mouthed diverticulum was found in                            the proximal descending colon.                           The colon (entire examined portion) appeared                            otherwise normal.                           The terminal ileum appeared normal.                           The retroflexed view of the distal rectum and anal                            verge was normal and showed no anal or rectal                            abnormalities. Complications:            No immediate complications. Estimated Blood Loss:     Estimated blood loss: none. Impression:               - Preparation of the colon was fair.                           - Diverticulosis in the proximal descending colon.                           - The entire examined colon is normal. No mass                            lesions or inflammatory changes in the  colon.                           - The examined portion of the ileum was normal.                           - The distal rectum and anal verge are normal on                            retroflexion view.                           - No specimens collected.                           - Suspect that patient's episode of abdominal pain                            and hematochezia  was secondary to ischemic colitis                            induced by running. Recommendation:           - Patient has a contact number available for                            emergencies. The signs and symptoms of potential                            delayed complications were discussed with the                            patient. Return to normal activities tomorrow.                            Written discharge instructions were provided to the                            patient.                           - Resume previous diet.                           - Continue present medications.                           - Repeat colonoscopy at age 46 for screening                            purposes. Recommend 2-day prep with next                            colonoscopy.                           - Ensure aggressive hydration and carbohydrate                            loading prior to running in the future. Aidan Moten E. Stacia, MD 11/23/2023 9:56:20 AM This report has been signed electronically.

## 2023-11-23 NOTE — Progress Notes (Signed)
 History and Physical Interval Note:  11/23/2023 8:47 AM  Jenna Gonzalez  has presented today for endoscopic procedure(s), with the diagnosis of  Encounter Diagnosis  Name Primary?   Rectal bleeding Yes  .  The various methods of evaluation and treatment have been discussed with the patient and/or family. After consideration of risks, benefits and other options for treatment, the patient has consented to  the endoscopic procedure(s).   The patient's history has been reviewed, patient examined, no change in status, stable for endoscopic procedure(s).  I have reviewed the patient's chart and labs.  Questions were answered to the patient's satisfaction.     Eleanora Guinyard E. Stacia, MD [facility]

## 2023-11-23 NOTE — Progress Notes (Signed)
 Agree with the assessment and plan as outlined by Nestor Blower, PA-C.  History of crampy abdominal pain followed by hematochezia in the setting of running/physical exertion suggestive of ischemic colitis.  Agree with colonoscopy to rule out mass lesion/IBD.

## 2023-11-23 NOTE — Progress Notes (Signed)
 Sedate, gd SR, tolerated procedure well, VSS, report to RN

## 2023-11-26 ENCOUNTER — Ambulatory Visit (INDEPENDENT_AMBULATORY_CARE_PROVIDER_SITE_OTHER): Admitting: Dermatology

## 2023-11-26 ENCOUNTER — Telehealth: Payer: Self-pay

## 2023-11-26 ENCOUNTER — Encounter: Payer: Self-pay | Admitting: Dermatology

## 2023-11-26 VITALS — BP 120/76 | HR 76

## 2023-11-26 DIAGNOSIS — L821 Other seborrheic keratosis: Secondary | ICD-10-CM

## 2023-11-26 DIAGNOSIS — L814 Other melanin hyperpigmentation: Secondary | ICD-10-CM | POA: Diagnosis not present

## 2023-11-26 DIAGNOSIS — L578 Other skin changes due to chronic exposure to nonionizing radiation: Secondary | ICD-10-CM | POA: Diagnosis not present

## 2023-11-26 DIAGNOSIS — D229 Melanocytic nevi, unspecified: Secondary | ICD-10-CM

## 2023-11-26 DIAGNOSIS — W908XXA Exposure to other nonionizing radiation, initial encounter: Secondary | ICD-10-CM | POA: Diagnosis not present

## 2023-11-26 DIAGNOSIS — D225 Melanocytic nevi of trunk: Secondary | ICD-10-CM | POA: Diagnosis not present

## 2023-11-26 DIAGNOSIS — D485 Neoplasm of uncertain behavior of skin: Secondary | ICD-10-CM

## 2023-11-26 DIAGNOSIS — D1801 Hemangioma of skin and subcutaneous tissue: Secondary | ICD-10-CM

## 2023-11-26 NOTE — Patient Instructions (Signed)
  VISIT SUMMARY: Today, you were seen for concerns about skin lesions on your back and chest. You have several spots that have been present for a long time, and you are particularly worried about a two-toned lesion on the left side of your back. Given your history of significant sun exposure and a previous biopsy showing atypical features, we performed a biopsy on the concerning lesion and scheduled a full body skin check.  YOUR PLAN: -DYSPLASTIC AND ATYPICAL NEVI: Dysplastic and atypical nevi are unusual-looking moles that can sometimes develop into skin cancer. We performed a biopsy on the atypical nevus on the left side of your back to rule out malignancy. Please keep the Band-Aid on until tomorrow, then you can shower and soak as needed. Apply Vaseline with a Band-Aid for about five days.  INSTRUCTIONS: We have scheduled a full body skin check to further evaluate your skin lesions. Please follow up as scheduled.                      Contains text generated by [employer].                                 Contains text generated by [employer].

## 2023-11-26 NOTE — Progress Notes (Signed)
 New Patient Visit   History of Present Illness Jenna Gonzalez is a 38 year old female who presents with concerns about skin lesions on her back and chest.  She has several spots on her lower back and chest, which have been present for a long time. She is unable to determine if they have changed due to difficulty seeing them clearly.  She is particularly concerned about a two-toned lesion on the left side of her back, which has been present for a long time and catches her attention due to its appearance.  She has a history of a skin biopsy on the back of her arm, which she recalls was noted to have some atypical features.  She reports significant sun exposure during her childhood and teenage years but now uses sunscreen with SPF 30 or more regularly. No personal or family history of melanoma. She mentions another spot with minimal sun exposure that she does not like, although it has been present for some time. She is cautious about such lesions and prefers to have them evaluated.  Patient's PCP is concerned about spots on her chest and back.   The following portions of the chart were reviewed this encounter and updated as appropriate: medications, allergies, medical history  Review of Systems:  No other skin or systemic complaints except as noted in HPI or Assessment and Plan.  Objective  Well appearing patient in no apparent distress; mood and affect are within normal limits.  A focal examination was performed including neck, chest, axillae, abdomen, back, bilateral upper extremities. All findings within normal limits unless otherwise noted below.   Relevant exam findings are noted in the Assessment and Plan.  Left lower back 1.2cm two toned brown papule  Left Breast 4mm brown macule   Assessment & Plan   LENTIGINES, SEBORRHEIC KERATOSES, HEMANGIOMAS - Benign normal skin lesions - Benign-appearing - Call for any changes  MELANOCYTIC NEVI - Tan-brown and/or  pink-flesh-colored symmetric macules and papules - Benign appearing on exam today - Observation - Call clinic for new or changing moles - Recommend daily use of broad spectrum spf 30+ sunscreen to sun-exposed areas.   ACTINIC DAMAGE - Chronic condition, secondary to cumulative UV/sun exposure - diffuse scaly erythematous macules with underlying dyspigmentation - Recommend daily broad spectrum sunscreen SPF 30+ to sun-exposed areas, reapply every 2 hours as needed.  - Staying in the shade or wearing long sleeves, sun glasses (UVA+UVB protection) and wide brim hats (4-inch brim around the entire circumference of the hat) are also recommended for sun protection.  - Call for new or changing lesions. NEOPLASM OF UNCERTAIN BEHAVIOR OF SKIN (2) Left lower back Skin / nail biopsy Type of biopsy: tangential   Informed consent: discussed and consent obtained   Timeout: patient name, date of birth, surgical site, and procedure verified   Procedure prep:  Patient was prepped and draped in usual sterile fashion Prep type:  Chlorhexidine Anesthesia: the lesion was anesthetized in a standard fashion   Anesthetic:  1% lidocaine  w/ epinephrine 1-100,000 buffered w/ 8.4% NaHCO3 Instrument used: DermaBlade   Hemostasis achieved with: aluminum chloride   Outcome: patient tolerated procedure well   Post-procedure details: sterile dressing applied and wound care instructions given   Dressing type: bandage and petrolatum    Specimen 1 - Surgical pathology Differential Diagnosis: r/o NMSC vs other  Check Margins: No Left Breast Skin / nail biopsy Type of biopsy: tangential   Informed consent: discussed and consent obtained   Timeout:  patient name, date of birth, surgical site, and procedure verified   Procedure prep:  Patient was prepped and draped in usual sterile fashion Prep type:  Isopropyl alcohol Anesthesia: the lesion was anesthetized in a standard fashion   Anesthetic:  1% lidocaine  w/  epinephrine 1-100,000 buffered w/ 8.4% NaHCO3 Instrument used: DermaBlade   Hemostasis achieved with: aluminum chloride   Outcome: patient tolerated procedure well   Post-procedure details: sterile dressing applied and wound care instructions given   Dressing type: petrolatum and bandage    Specimen 2 - Surgical pathology Differential Diagnosis: r/o NMSC vs other  Check Margins: No  Return for TBSE with brenda.   Documentation: I have reviewed the above documentation for accuracy and completeness, and I agree with the above.  RUFUS CHRISTELLA HOLY, MD

## 2023-11-26 NOTE — Telephone Encounter (Signed)
 No answer on follow up call - voice mail message left

## 2023-11-27 LAB — SURGICAL PATHOLOGY

## 2023-12-04 ENCOUNTER — Ambulatory Visit: Payer: Self-pay | Admitting: Dermatology

## 2023-12-25 ENCOUNTER — Ambulatory Visit: Admitting: Gastroenterology

## 2024-07-01 ENCOUNTER — Ambulatory Visit: Admitting: Physician Assistant
